# Patient Record
Sex: Female | Born: 1980 | Race: White | Hispanic: No | Marital: Married | Smoking: Never smoker
Health system: Southern US, Community
[De-identification: ages and names within clinical notes are randomized; demographics above are authoritative.]

## PROBLEM LIST (undated history)

## (undated) DIAGNOSIS — Z86018 Personal history of other benign neoplasm: Secondary | ICD-10-CM

## (undated) DIAGNOSIS — E7211 Homocystinuria: Secondary | ICD-10-CM

## (undated) DIAGNOSIS — F419 Anxiety disorder, unspecified: Secondary | ICD-10-CM

## (undated) DIAGNOSIS — F988 Other specified behavioral and emotional disorders with onset usually occurring in childhood and adolescence: Secondary | ICD-10-CM

## (undated) DIAGNOSIS — E7212 Methylenetetrahydrofolate reductase deficiency: Secondary | ICD-10-CM

## (undated) HISTORY — DX: Other specified behavioral and emotional disorders with onset usually occurring in childhood and adolescence: F98.8

## (undated) HISTORY — DX: Personal history of other benign neoplasm: Z86.018

## (undated) HISTORY — DX: Anxiety disorder, unspecified: F41.9

---

## 2011-01-10 NOTE — L&D Delivery Note (Signed)
Delivery Note  First Stage: Labor onset: 0300 Augmentation : none Analgesia /Anesthesia intrapartum: none Light meconium SROM  Second Stage: Complete dilation at 1421 Onset of pushing at 1425 FHR second stage 140  Delivery of a viable female at 1430 by CNM in ROA position.  no nuchal cord - short cord noted Cord double clamped after cessation of pulsation, cut by FOB.  Cord blood sample collected.   Third Stage: Placenta delivered Shultz at 1439 intact with 3 VC.  Placenta - for hospital disposal. Uterine tone firm / small bleeding.  No perineal or vaginal laceration identified - bilateral labial abrasion - single 4-0 vicryl at base left labial abrasion for hemostasis Anesthesia for repair: 1% xylocaine local Repair none needed Est. Blood Loss (mL):  Complications: none  Mom to postpartum.  Baby to Mom for bonding.  Newborn: Birth Weight: 7lb / 2 oz Apgar Scores: 9-10 Feeding planned: breastfeeding  Marlinda Mike CNM, MSN 12/02/2011, 2:58 PM

## 2011-05-03 LAB — OB RESULTS CONSOLE HEPATITIS B SURFACE ANTIGEN: Hepatitis B Surface Ag: NEGATIVE

## 2011-05-03 LAB — OB RESULTS CONSOLE VARICELLA ZOSTER ANTIBODY, IGG: Varicella: IMMUNE

## 2011-05-03 LAB — OB RESULTS CONSOLE ANTIBODY SCREEN: Antibody Screen: NEGATIVE

## 2011-05-12 ENCOUNTER — Other Ambulatory Visit: Payer: Self-pay

## 2011-05-12 LAB — OB RESULTS CONSOLE GC/CHLAMYDIA
Chlamydia: NEGATIVE
Gonorrhea: NEGATIVE

## 2011-06-02 ENCOUNTER — Encounter (HOSPITAL_COMMUNITY): Payer: Self-pay

## 2011-06-02 ENCOUNTER — Ambulatory Visit (HOSPITAL_COMMUNITY)
Admission: RE | Admit: 2011-06-02 | Discharge: 2011-06-02 | Disposition: A | Discharge status: Home / Self Care | Payer: BC Managed Care – PPO | Source: Ambulatory Visit | Attending: Obstetrics & Gynecology | Admitting: Obstetrics & Gynecology

## 2011-06-02 DIAGNOSIS — O09299 Supervision of pregnancy with other poor reproductive or obstetric history, unspecified trimester: Secondary | ICD-10-CM

## 2011-06-02 HISTORY — DX: Methylenetetrahydrofolate reductase deficiency: E72.12

## 2011-06-02 HISTORY — DX: Homocystinuria: E72.11

## 2011-06-02 NOTE — Progress Notes (Addendum)
MATERNAL FETAL MEDICINE CONSULT  Patient Name: Jasmine Combs Medical Record Number:  [id number] Date of Birth: Apr 11, 1980 Requesting Physician Name:  Robley Fries, MD Date of Service: 06/02/2011  Chief Complaint Fetal growth restriction in prior pregnancy  History of Present Illness Jasmine Combs was seen today for prenatal diagnosis secondary to fetal growth restriction in a prior pregnancy at the request of Robley Fries, MD.  The patient is a 31 y.o. G2P0100 at [redacted]w[redacted]d with a EDC of11/27/2013, by Last Menstrual Period  Jasmine Combs's last pregnancy was complicated by fetal growth restriction initially discovered at 28 weeks of gestation.  This ultimately required hospitalization and delivery via LTCS at 32 weeks for fetal distress.  Around the time of delivery she blood pressure became elevated.  Her physician suspected she had developed preeclampsia but this was not confirmed with laboratory studies.  She underwent a thrombophilia workup soon after the pregnancy.  It was negative save for the finding of a MTHFR mutation.  The patient is currently taking extra folic acid secondary to this.  She presents today to discuss recurrence of a similar problems in this pregnancy.   Review of Systems Pertinent items are noted in HPI.  Patient History OB History    Grav Para Term Preterm Abortions TAB SAB Ect Mult Living   2 1  1            # Outc Date GA Lbr Len/2nd Wgt Sex Del Anes PTL Lv   1 PRE 2008 [redacted]w[redacted]d    LTCS      Comments: Fetal growth restriction, placental abruption   2 CUR               Past Medical History  Diagnosis Date  . MTHFR (methylene THF reductase) deficiency and homocystinuria     Past Surgical History  Procedure Date  . Cesarean section     History   Social History  . Marital Status: Married    Spouse Name: N/A    Number of Children: N/A  . Years of Education: N/A   Social History Main Topics  . Smoking status: Never Smoker   . Smokeless tobacco: None    . Alcohol Use: No  . Drug Use: No  . Sexually Active: Yes    Birth Control/ Protection: None   Other Topics Concern  . None   Social History Narrative  . None   Family History The patient has no family history of mental retardation, birth defects, or genetic diseases.  Physical Examination Vitals:  BP 105/60, P 62, Weight 143 lbs. General appearance - alert, well appearing, and in no distress  Assessment and Recommendations 1.  Fetal growth restriction in prior pregnancy.  The recurrence rate of fetal growth restriction for Jasmine Combs is difficult to determine.  Although it was not confirmed, the patient was told there was concern for preeclampsia.  If present this would represent early onset preeclampsia which would carry a 25% recurrence risk.  If one were to discount the role of preeclampsia in her prior fetal growth restriction it is virtually impossible to predict recurrence risk, as she has no other obvious risk factors.  However, this point is mostly academic at this time as there is no effective intervention to the risk of recurrent preeclampsia.  She will require very little outside of routine prenatal care.  I would recommend serial growth scans every 4-6 weeks beginning after her initial fetal anatomic survey at approximately 18 weeks.    I spent  30 minutes with Jasmine Combs today of which 50% was face-to-face counseling.   Rema Fendt, MD

## 2011-11-27 ENCOUNTER — Inpatient Hospital Stay (HOSPITAL_COMMUNITY)
Admission: AD | Admit: 2011-11-27 | Discharge: 2011-11-27 | Disposition: A | Discharge status: Home / Self Care | Payer: BC Managed Care – PPO | Source: Ambulatory Visit | Attending: Obstetrics and Gynecology | Admitting: Obstetrics and Gynecology

## 2011-11-27 ENCOUNTER — Encounter (HOSPITAL_COMMUNITY): Payer: Self-pay

## 2011-11-27 DIAGNOSIS — O99891 Other specified diseases and conditions complicating pregnancy: Secondary | ICD-10-CM | POA: Insufficient documentation

## 2011-11-27 LAB — AMNISURE RUPTURE OF MEMBRANE (ROM) NOT AT ARMC: Amnisure ROM: NEGATIVE

## 2011-11-27 LAB — OB RESULTS CONSOLE GBS: GBS: POSITIVE

## 2011-11-27 NOTE — MAU Note (Signed)
Gush of clear fluid at 3am this morning. Has been leaking small amount of fluid since then but can't tell if she's leaking urine or not. Denies vaginal bleeding or contractions.

## 2011-12-02 ENCOUNTER — Encounter (HOSPITAL_COMMUNITY): Payer: Self-pay | Admitting: *Deleted

## 2011-12-02 ENCOUNTER — Inpatient Hospital Stay (HOSPITAL_COMMUNITY)
Admission: AD | Admit: 2011-12-02 | Discharge: 2011-12-03 | DRG: 373 | Disposition: A | Discharge status: Home / Self Care | Payer: BC Managed Care – PPO | Source: Ambulatory Visit | Attending: Obstetrics and Gynecology | Admitting: Obstetrics and Gynecology

## 2011-12-02 DIAGNOSIS — IMO0001 Reserved for inherently not codable concepts without codable children: Secondary | ICD-10-CM

## 2011-12-02 DIAGNOSIS — O34219 Maternal care for unspecified type scar from previous cesarean delivery: Principal | ICD-10-CM | POA: Diagnosis present

## 2011-12-02 LAB — TYPE AND SCREEN
ABO/RH(D): A POS
Antibody Screen: NEGATIVE

## 2011-12-02 LAB — CBC
HCT: 33.8 % — ABNORMAL LOW (ref 36.0–46.0)
Hemoglobin: 12 g/dL (ref 12.0–15.0)
MCHC: 35.5 g/dL (ref 30.0–36.0)
RBC: 3.65 MIL/uL — ABNORMAL LOW (ref 3.87–5.11)
WBC: 11.9 10*3/uL — ABNORMAL HIGH (ref 4.0–10.5)

## 2011-12-02 LAB — ABO/RH: ABO/RH(D): A POS

## 2011-12-02 MED ORDER — IBUPROFEN 800 MG PO TABS
800.0000 mg | ORAL_TABLET | Freq: Three times a day (TID) | ORAL | Status: DC
  Administered 2011-12-02 – 2011-12-03 (×3): 800 mg via ORAL
  Filled 2011-12-02 (×3): qty 1

## 2011-12-02 MED ORDER — OXYTOCIN 40 UNITS IN LACTATED RINGERS INFUSION - SIMPLE MED
62.5000 mL/h | INTRAVENOUS | Status: DC
  Administered 2011-12-02: 62.5 mL/h via INTRAVENOUS
  Filled 2011-12-02: qty 1000

## 2011-12-02 MED ORDER — ACETAMINOPHEN 325 MG PO TABS: 650.0000 mg | ORAL_TABLET | ORAL | Status: DC | PRN

## 2011-12-02 MED ORDER — FLEET ENEMA 7-19 GM/118ML RE ENEM: 1.0000 | ENEMA | RECTAL | Status: DC | PRN

## 2011-12-02 MED ORDER — LANOLIN HYDROUS EX OINT: TOPICAL_OINTMENT | CUTANEOUS | Status: DC | PRN

## 2011-12-02 MED ORDER — DIPHENHYDRAMINE HCL 25 MG PO CAPS: 25.0000 mg | ORAL_CAPSULE | Freq: Four times a day (QID) | ORAL | Status: DC | PRN

## 2011-12-02 MED ORDER — IBUPROFEN 600 MG PO TABS
600.0000 mg | ORAL_TABLET | Freq: Four times a day (QID) | ORAL | Status: DC | PRN
  Administered 2011-12-02: 600 mg via ORAL
  Filled 2011-12-02: qty 1

## 2011-12-02 MED ORDER — CITRIC ACID-SODIUM CITRATE 334-500 MG/5ML PO SOLN: 30.0000 mL | ORAL | Status: DC | PRN

## 2011-12-02 MED ORDER — LACTATED RINGERS IV SOLN
INTRAVENOUS | Status: DC
  Administered 2011-12-02: 08:00:00 via INTRAVENOUS

## 2011-12-02 MED ORDER — OXYTOCIN BOLUS FROM INFUSION: 500.0000 mL | INTRAVENOUS | Status: DC

## 2011-12-02 MED ORDER — PENICILLIN G POTASSIUM 5000000 UNITS IJ SOLR
5.0000 10*6.[IU] | Freq: Once | INTRAVENOUS | Status: AC
  Administered 2011-12-02: 5 10*6.[IU] via INTRAVENOUS
  Filled 2011-12-02: qty 5

## 2011-12-02 MED ORDER — LACTATED RINGERS IV SOLN: 500.0000 mL | INTRAVENOUS | Status: DC | PRN

## 2011-12-02 MED ORDER — OXYCODONE-ACETAMINOPHEN 5-325 MG PO TABS
1.0000 | ORAL_TABLET | ORAL | Status: DC | PRN
  Filled 2011-12-02: qty 2

## 2011-12-02 MED ORDER — LIDOCAINE HCL (PF) 1 % IJ SOLN
30.0000 mL | INTRAMUSCULAR | Status: DC | PRN
  Administered 2011-12-02: 30 mL via SUBCUTANEOUS
  Filled 2011-12-02: qty 30

## 2011-12-02 MED ORDER — SIMETHICONE 80 MG PO CHEW: 80.0000 mg | CHEWABLE_TABLET | ORAL | Status: DC | PRN

## 2011-12-02 MED ORDER — BENZOCAINE-MENTHOL 20-0.5 % EX AERO
1.0000 "application " | INHALATION_SPRAY | CUTANEOUS | Status: DC | PRN
  Administered 2011-12-02: 1 via TOPICAL
  Filled 2011-12-02: qty 56

## 2011-12-02 MED ORDER — FOLIC ACID 1 MG PO TABS
1.0000 mg | ORAL_TABLET | Freq: Every day | ORAL | Status: DC
  Administered 2011-12-02 – 2011-12-03 (×2): 1 mg via ORAL
  Filled 2011-12-02 (×3): qty 1

## 2011-12-02 MED ORDER — PENICILLIN G POTASSIUM 5000000 UNITS IJ SOLR
2.5000 10*6.[IU] | INTRAMUSCULAR | Status: DC
  Administered 2011-12-02: 2.5 10*6.[IU] via INTRAVENOUS
  Filled 2011-12-02 (×5): qty 2.5

## 2011-12-02 MED ORDER — ONDANSETRON HCL 4 MG/2ML IJ SOLN: 4.0000 mg | Freq: Four times a day (QID) | INTRAMUSCULAR | Status: DC | PRN

## 2011-12-02 MED ORDER — OXYCODONE-ACETAMINOPHEN 5-325 MG PO TABS: 1.0000 | ORAL_TABLET | ORAL | Status: DC | PRN

## 2011-12-02 NOTE — H&P (Signed)
NAMEMarland Combs  PAYING, ARISPE NO.:  [id number]  MEDICAL RECORD NO.:  [id number]  LOCATION:  9133                          FACILITY:  [facility]  PHYSICIAN:  Lenoard Aden, M.D.DATE OF BIRTH:  [birth year]  DATE OF ADMISSION:  12/02/2011 DATE OF DISCHARGE:                             HISTORY & PHYSICAL   CHIEF COMPLAINT:  Labor.  HISTORY OF PRESENT ILLNESS:  She is a 31 year old white female, G2, P1 at 39-3/7th weeks for trial of labor after cesarean section, who presents with cervical change in labor.  She has no known drug allergies.  MEDICATIONS:  Prenatal vitamins, folic acid, Colace p.r.n.  She is a nonsmoker and nondrinker.  She denies domestic or physical violence.  FAMILY HISTORY:  Breast cancer, diabetes, hypertension, heart disease. She has a previous history of a 32-week severe IUGR breech presenting fetus, which was delivered by cesarean section without complication.  No other surgical hospitalizations were noted.  Prenatal course was uncomplicated except for history of positive GBS.  PHYSICAL EXAMINATION:  GENERAL:  A well-developed, well-nourished, white female, in no acute distress. HEENT:  Normal. NECK:  Supple.  Full range of motion. LUNGS:  Clear. HEART:  Regular rate and rhythm. ABDOMEN:  Soft, gravid, nontender.  No CVA tenderness.  Cervix per RN 4- 5, 60%, vertex, -2. EXTREMITIES:  There are no cords. NEUROLOGIC:  Nonfocal. SKIN:  Intact.  IMPRESSION: 1. A 39-week intrauterine pregnancy. 2. Previous cesarean section for trial of labor.  PLAN:  Proceed with trial of labor, anticipate cautious attempts at vaginal delivery.     Lenoard Aden, M.D.     RJT/MEDQ  D:  12/02/2011  T:  12/02/2011  Job:  [id number]

## 2011-12-02 NOTE — MAU Note (Signed)
Dr. Billy Coast notified of cervical change. Orders rec'd.

## 2011-12-02 NOTE — Progress Notes (Signed)
S:  Lots of pressure - starting to feel urge to push  O:  VS: Blood pressure 115/89, pulse 59, temperature 98 F (36.7 C), temperature source Oral, resp. rate 18, height 5\' 6"  (1.676 m), weight 77.565 kg (171 lb), last menstrual period 03/01/2011.        FHR : baseline 140 / variability moderate / accels + / decels none        Toco: contractions every 2-3 minutes spontaneous labor        Cervix : rim / 95% / vtx  / +1        Membranes: slight yellow color to fluids now - previously clear  A: active labor     FHR category 1  P: anticipate SVB - VBAC     Marlinda Mike CNM, MSN 12/02/2011, 2:18 PM

## 2011-12-02 NOTE — MAU Note (Signed)
Pt to walk.

## 2011-12-02 NOTE — MAU Note (Signed)
Pt returned from walking, monitors applied. 

## 2011-12-02 NOTE — Progress Notes (Signed)
Jasmine Combs is a 31 y.o. G2P0101 at [redacted]w[redacted]d by LMP admitted for active labor  Subjective: Coping well  Objective: BP 121/71  Pulse 76  Temp 98.1 F (36.7 C) (Oral)  Resp 18  Ht 5\' 6"  (1.676 m)  Wt 77.565 kg (171 lb)  BMI 27.60 kg/m2  LMP 03/01/2011      FHT:  FHR: 155 bpm, variability: moderate,  accelerations:  Present,  decelerations:  Absent UC:   irregular, every 3-7 minutes SVE:   Dilation: 4.5 Effacement (%): 80 Station: -2 Exam by:: Rudi Coco RN  Labs: No results found for this basename: WBC, HGB, HCT, MCV, PLT    Assessment / Plan: Spontaneous labor, progressing normally TOLAC  Labor: Progressing normally Preeclampsia:  na Fetal Wellbeing:  Category I Pain Control:  Labor support without medications I/D:  n/a Anticipated MOD:  attempted TOLAC  Jasmine Combs 12/02/2011, 8:11 AM

## 2011-12-02 NOTE — Progress Notes (Signed)
IV removed per Provider order

## 2011-12-02 NOTE — MAU Note (Signed)
Dr. Billy Coast notified of pt.  Pt to walk x 1 hr and recheck.

## 2011-12-02 NOTE — MAU Note (Signed)
Pt G2 P1 at 39.3wks having contractions.

## 2011-12-03 MED ORDER — TETANUS-DIPHTH-ACELL PERTUSSIS 5-2.5-18.5 LF-MCG/0.5 IM SUSP
0.5000 mL | Freq: Once | INTRAMUSCULAR | Status: AC
  Administered 2011-12-03: 0.5 mL via INTRAMUSCULAR
  Filled 2011-12-03: qty 0.5

## 2011-12-03 MED ORDER — ACETAMINOPHEN 325 MG PO TABS: 650.0000 mg | ORAL_TABLET | ORAL | Status: DC | PRN

## 2011-12-03 MED ORDER — IBUPROFEN 800 MG PO TABS: 800.0000 mg | ORAL_TABLET | Freq: Three times a day (TID) | ORAL | Status: DC

## 2011-12-03 NOTE — Progress Notes (Signed)
PPD 1 SVD / VBAC  S:  Reports feeling well no concerns or pain             Tolerating po/ No nausea or vomiting             Bleeding is light             Pain controlled with motrin and dermaplast             Up ad lib / ambulatory  Newborn breast feeding well / Female Jasmine Combs   O:               VS: BP 108/66  Pulse 67  Temp 97.8 F (36.6 C) (Oral)  Resp 18  Ht 5\' 6"  (1.676 m)  Wt 77.565 kg (171 lb)  BMI 27.60 kg/m2  SpO2 98%  LMP 03/01/2011  Breastfeeding? Unknown   LABS:  Basename 12/02/11 0748  WBC 11.9*  HGB 12.0  PLT 213                  Physical Exam:             Alert and oriented X3  Lungs: Clear and unlabored  Heart: regular rate and rhythm / no mumurs  Abdomen: soft, non-tender, non-distended              Fundus: firm, non-tender, U-1  Perineum: no edema  Lochia: light  Extremities: no edema, no calf pain or tenderness    A: PPD # 1 VBAC - natural childbirth   Doing well - stable status  P:  Routine post partum orders  Early discharge if ok with Peds  Marlinda Mike CNM, MSN 12/03/2011, 10:12 AM

## 2011-12-03 NOTE — Discharge Summary (Signed)
Obstetric Discharge Summary  Reason for Admission: onset of labor / TOLAC Prenatal Procedures: none Intrapartum Procedures: spontaneous vaginal delivery and GBS prophylaxis Postpartum Procedures: TdaP (ordered at office but not given) Complications-Operative and Postpartum: none Hemoglobin  Date Value Range Status  12/02/2011 12.0  12.0 - 15.0 g/dL Final     HCT  Date Value Range Status  12/02/2011 33.8* 36.0 - 46.0 % Final    Physical Exam:  General: alert, cooperative and no distress Lochia: appropriate Uterine Fundus: firm Incision: intact perineum DVT Evaluation: No evidence of DVT seen on physical exam.  Discharge Diagnoses: Term Pregnancy-delivered / successful VBAC  Discharge Information: Date: 12/03/2011 Activity: pelvic rest Diet: routine Medications: PNV and Ibuprofen Condition: stable Instructions: refer to practice specific booklet Discharge to: home Follow-up Information    Follow up with MODY,VAISHALI R, MD. Schedule an appointment as soon as possible for a visit in 6 weeks.   Contact information:   [address] [place] [address] [phone]          Newborn Data: Live born female  Birth Weight: 7 lb 2.8 oz (3255 g) APGAR: 9, 10  Home with mother.  Marlinda Mike 12/03/2011, 10:18 AM

## 2011-12-04 NOTE — Discharge Summary (Signed)
Reviewed and agree with note and plan. V.Vincient Vanaman, MD  

## 2012-12-23 ENCOUNTER — Encounter: Payer: Self-pay | Admitting: Physician Assistant

## 2012-12-25 ENCOUNTER — Ambulatory Visit (INDEPENDENT_AMBULATORY_CARE_PROVIDER_SITE_OTHER): Payer: BC Managed Care – PPO | Admitting: Physician Assistant

## 2012-12-25 ENCOUNTER — Encounter: Payer: Self-pay | Admitting: Physician Assistant

## 2012-12-25 VITALS — BP 118/62 | HR 60 | Temp 97.9°F | Resp 16 | Ht 67.0 in | Wt 153.0 lb

## 2012-12-25 DIAGNOSIS — Z79899 Other long term (current) drug therapy: Secondary | ICD-10-CM

## 2012-12-25 DIAGNOSIS — E559 Vitamin D deficiency, unspecified: Secondary | ICD-10-CM

## 2012-12-25 DIAGNOSIS — F411 Generalized anxiety disorder: Secondary | ICD-10-CM

## 2012-12-25 DIAGNOSIS — F988 Other specified behavioral and emotional disorders with onset usually occurring in childhood and adolescence: Secondary | ICD-10-CM

## 2012-12-25 LAB — LIPID PANEL
Cholesterol: 188 mg/dL (ref 0–200)
HDL: 76 mg/dL (ref >39)
LDL Cholesterol: 101 mg/dL — ABNORMAL HIGH (ref 0–99)
Triglycerides: 55 mg/dL (ref <150)

## 2012-12-25 LAB — BASIC METABOLIC PANEL WITH GFR
BUN: 15 mg/dL (ref 6–23)
Calcium: 9.3 mg/dL (ref 8.4–10.5)
Chloride: 103 mEq/L (ref 96–112)
GFR, Est African American: 81 mL/min
GFR, Est Non African American: 70 mL/min
Glucose, Bld: 91 mg/dL (ref 70–99)
Potassium: 4.5 mEq/L (ref 3.5–5.3)
Sodium: 138 mEq/L (ref 135–145)

## 2012-12-25 LAB — HEPATIC FUNCTION PANEL
ALT: 11 U/L (ref 0–35)
AST: 16 U/L (ref 0–37)
Albumin: 4.4 g/dL (ref 3.5–5.2)
Bilirubin, Direct: 0.1 mg/dL (ref 0.0–0.3)
Total Bilirubin: 0.6 mg/dL (ref 0.3–1.2)
Total Protein: 6.6 g/dL (ref 6.0–8.3)

## 2012-12-25 LAB — CBC WITH DIFFERENTIAL/PLATELET
Basophils Absolute: 0 10*3/uL (ref 0.0–0.1)
Basophils Relative: 0 % (ref 0–1)
Eosinophils Absolute: 0.1 10*3/uL (ref 0.0–0.7)
HCT: 40.3 % (ref 36.0–46.0)
Hemoglobin: 13.5 g/dL (ref 12.0–15.0)
MCH: 30.7 pg (ref 26.0–34.0)
MCHC: 33.5 g/dL (ref 30.0–36.0)
Monocytes Absolute: 0.5 10*3/uL (ref 0.1–1.0)
Monocytes Relative: 8 % (ref 3–12)
Neutro Abs: 3.9 10*3/uL (ref 1.7–7.7)
Platelets: 201 10*3/uL (ref 150–400)
RBC: 4.4 MIL/uL (ref 3.87–5.11)
RDW: 13 % (ref 11.5–15.5)

## 2012-12-25 LAB — MAGNESIUM: Magnesium: 1.9 mg/dL (ref 1.5–2.5)

## 2012-12-25 MED ORDER — AMPHETAMINE-DEXTROAMPHETAMINE 20 MG PO TABS: 20.0000 mg | ORAL_TABLET | Freq: Three times a day (TID) | ORAL | Status: DC | PRN

## 2012-12-25 NOTE — Progress Notes (Signed)
HPI Patient presents for a follow up. She started to be followed by OB/GYN for her pregnancy with her daughter, [name], who is a year old. She also has a 32 year old. She is no longer breast feeding and is planning on going back to school to start masters and goes back Jan 12. She states that the adderall helps her and will take as needed.   Past Medical History  Diagnosis Date  . MTHFR (methylene THF reductase) deficiency and homocystinuria   . Preterm labor   . History of dysplastic nevus      No Known Allergies    Current Outpatient Prescriptions on File Prior to Visit  Medication Sig Dispense Refill  . amphetamine-dextroamphetamine (ADDERALL) 10 MG tablet Take 10 mg by mouth 2 (two) times daily with a meal.       No current facility-administered medications on file prior to visit.    ROS: all negative expect above.   Physical: Filed Weights   12/25/12 1104  Weight: 153 lb (69.4 kg)   Filed Vitals:   12/25/12 1104  BP: 118/62  Pulse: 60  Temp: 97.9 F (36.6 C)  Resp: 16   General Appearance: Well nourished, in no apparent distress. Eyes: PERRLA, EOMs. Sinuses: No Frontal/maxillary tenderness ENT/Mouth: Ext aud canals clear, normal light reflex with TMs without erythema, bulging. Post pharynx without erythema, swelling, exudate.  Respiratory: CTAB Cardio: RRR, no murmurs, rubs or gallops. Peripheral pulses brisk and equal bilaterally, without edema. No aortic or femoral bruits. Abdomen: Soft, with bowl sounds. Nontender, no guarding, rebound. Lymphatics: Non tender without lymphadenopathy.  Musculoskeletal: Full ROM all peripheral extremities, 5/5 strength, and normal gait. Skin: Warm, dry without rashes, lesions, ecchymosis.  Neuro: Cranial nerves intact, reflexes equal bilaterally. Normal muscle tone, no cerebellar symptoms. Sensation intact.  Pysch: Awake and oriented X 3, normal affect, Insight and Judgment appropriate.   Assessment and Plan: ADD- Adderall 20mg   TID #90 NR  Will follow up as needed Check labs

## 2012-12-25 NOTE — Patient Instructions (Signed)
Attention Deficit Hyperactivity Disorder Attention deficit hyperactivity disorder (ADHD) is a problem with behavior issues based on the way the brain functions (neurobehavioral disorder). It is a common reason for behavior and academic problems in school. CAUSES  The cause of ADHD is unknown in most cases. It may run in families. It sometimes can be associated with learning disabilities and other behavioral problems. SYMPTOMS  There are 3 types of ADHD. The 3 types and some of the symptoms include:  Inattentive  Gets bored or distracted easily.  Loses or forgets things. Forgets to hand in homework.  Has trouble organizing or completing tasks.  Difficulty staying on task.  An inability to organize daily tasks and school work.  Leaving projects, chores, or homework unfinished.  Trouble paying attention or responding to details. Careless mistakes.  Difficulty following directions. Often seems like is not listening.  Dislikes activities that require sustained attention (like chores or homework).  Hyperactive-impulsive  Feels like it is impossible to sit still or stay in a seat. Fidgeting with hands and feet.  Trouble waiting turn.  Talking too much or out of turn. Interruptive.  Speaks or acts impulsively.  Aggressive, disruptive behavior.  Constantly busy or on the go, noisy.  Combined  Has symptoms of both of the above. Often children with ADHD feel discouraged about themselves and with school. They often perform well below their abilities in school. These symptoms can cause problems in home, school, and in relationships with peers. As children get older, the excess motor activities can calm down, but the problems with paying attention and staying organized persist. Most children do not outgrow ADHD but with good treatment can learn to cope with the symptoms. DIAGNOSIS  When ADHD is suspected, the diagnosis should be made by professionals trained in ADHD.  Diagnosis will  include:  Ruling out other reasons for the child's behavior.  The caregivers will check with the child's school and check their medical records.  They will talk to teachers and parents.  Behavior rating scales for the child will be filled out by those dealing with the child on a daily basis. A diagnosis is made only after all information has been considered. TREATMENT  Treatment usually includes behavioral treatment often along with medicines. It may include stimulant medicines. The stimulant medicines decrease impulsivity and hyperactivity and increase attention. Other medicines used include antidepressants and certain blood pressure medicines. Most experts agree that treatment for ADHD should address all aspects of the child's functioning. Treatment should not be limited to the use of medicines alone. Treatment should include structured classroom management. The parents must receive education to address rewarding good behavior, discipline, and limit-setting. Tutoring or behavioral therapy or both should be available for the child. If untreated, the disorder can have long-term serious effects into adolescence and adulthood. HOME CARE INSTRUCTIONS   Often with ADHD there is a lot of frustration among the family in dealing with the illness. There is often blame and anger that is not warranted. This is a life long illness. There is no way to prevent ADHD. In many cases, because the problem affects the family as a whole, the entire family may need help. A therapist can help the family find better ways to handle the disruptive behaviors and promote change. If the child is young, most of the therapist's work is with the parents. Parents will learn techniques for coping with and improving their child's behavior. Sometimes only the child with the ADHD needs counseling. Your caregivers can help   you make these decisions.  Children with ADHD may need help in organizing. Some helpful tips include:  Keep  routines the same every day from wake-up time to bedtime. Schedule everything. This includes homework and playtime. This should include outdoor and indoor recreation. Keep the schedule on the refrigerator or a bulletin board where it is frequently seen. Mark schedule changes as far in advance as possible.  Have a place for everything and keep everything in its place. This includes clothing, backpacks, and school supplies.  Encourage writing down assignments and bringing home needed books.  Offer your child a well-balanced diet. Breakfast is especially important for school performance. Children should avoid drinks with caffeine including:  Soft drinks.  Coffee.  Tea.  However, some older children (adolescents) may find these drinks helpful in improving their attention.  Children with ADHD need consistent rules that they can understand and follow. If rules are followed, give small rewards. Children with ADHD often receive, and expect, criticism. Look for good behavior and praise it. Set realistic goals. Give clear instructions. Look for activities that can foster success and self-esteem. Make time for pleasant activities with your child. Give lots of affection.  Parents are their children's greatest advocates. Learn as much as possible about ADHD. This helps you become a stronger and better advocate for your child. It also helps you educate your child's teachers and instructors if they feel inadequate in these areas. Parent support groups are often helpful. A national group with local chapters is called CHADD (Children and Adults with Attention Deficit Hyperactivity Disorder). PROGNOSIS  There is no cure for ADHD. Children with the disorder seldom outgrow it. Many find adaptive ways to accommodate the ADHD as they mature. SEEK MEDICAL CARE IF:  Your child has repeated muscle twitches, cough or speech outbursts.  Your child has sleep problems.  Your child has a marked loss of  appetite.  Your child develops depression.  Your child has new or worsening behavioral problems.  Your child develops dizziness.  Your child has a racing heart.  Your child has stomach pains.  Your child develops headaches. Document Released: 12/16/2001 Document Revised: 03/20/2011 Document Reviewed: 07/17/2012 [employer] Patient Information 2014 [employer].  

## 2013-01-07 ENCOUNTER — Telehealth: Payer: Self-pay

## 2013-01-07 ENCOUNTER — Other Ambulatory Visit: Payer: Self-pay | Admitting: Physician Assistant

## 2013-01-07 MED ORDER — AMPHETAMINE-DEXTROAMPHETAMINE 20 MG PO TABS: 20.0000 mg | ORAL_TABLET | Freq: Two times a day (BID) | ORAL | Status: DC

## 2013-01-07 NOTE — Telephone Encounter (Signed)
Advised patient that insurance will not cover Adderall 10mg  three times daiky, Marchelle Folks prescribed Adderall 20mg  twice daily and advised patient to break tabs in half, she was advised per Marchelle Folks that this RX should last a long time

## 2013-04-16 ENCOUNTER — Other Ambulatory Visit: Payer: Self-pay | Admitting: Physician Assistant

## 2013-05-01 ENCOUNTER — Other Ambulatory Visit: Payer: Self-pay | Admitting: Emergency Medicine

## 2013-05-01 MED ORDER — AMPHETAMINE-DEXTROAMPHETAMINE 20 MG PO TABS: 20.0000 mg | ORAL_TABLET | Freq: Two times a day (BID) | ORAL | Status: DC

## 2013-09-09 ENCOUNTER — Other Ambulatory Visit: Payer: Self-pay | Admitting: Physician Assistant

## 2013-09-09 MED ORDER — AMPHETAMINE-DEXTROAMPHETAMINE 20 MG PO TABS: 20.0000 mg | ORAL_TABLET | Freq: Two times a day (BID) | ORAL | Status: DC

## 2013-11-10 ENCOUNTER — Encounter: Payer: Self-pay | Admitting: Physician Assistant

## 2013-11-28 ENCOUNTER — Other Ambulatory Visit: Payer: Self-pay | Admitting: Internal Medicine

## 2013-11-28 DIAGNOSIS — F988 Other specified behavioral and emotional disorders with onset usually occurring in childhood and adolescence: Secondary | ICD-10-CM

## 2013-11-28 MED ORDER — AMPHETAMINE-DEXTROAMPHETAMINE 20 MG PO TABS: ORAL_TABLET | ORAL | Status: DC

## 2013-12-25 ENCOUNTER — Encounter: Payer: Self-pay | Admitting: Physician Assistant

## 2013-12-25 ENCOUNTER — Ambulatory Visit (INDEPENDENT_AMBULATORY_CARE_PROVIDER_SITE_OTHER): Payer: BC Managed Care – PPO | Admitting: Physician Assistant

## 2013-12-25 ENCOUNTER — Other Ambulatory Visit (HOSPITAL_COMMUNITY)
Admission: RE | Admit: 2013-12-25 | Discharge: 2013-12-25 | Disposition: A | Discharge status: Home / Self Care | Payer: BC Managed Care – PPO | Source: Ambulatory Visit | Attending: Physician Assistant | Admitting: Physician Assistant

## 2013-12-25 VITALS — BP 102/62 | HR 60 | Temp 98.0°F | Resp 16 | Ht 67.0 in | Wt 137.0 lb

## 2013-12-25 DIAGNOSIS — R946 Abnormal results of thyroid function studies: Secondary | ICD-10-CM

## 2013-12-25 DIAGNOSIS — E785 Hyperlipidemia, unspecified: Secondary | ICD-10-CM

## 2013-12-25 DIAGNOSIS — Z79899 Other long term (current) drug therapy: Secondary | ICD-10-CM

## 2013-12-25 DIAGNOSIS — N926 Irregular menstruation, unspecified: Secondary | ICD-10-CM | POA: Insufficient documentation

## 2013-12-25 DIAGNOSIS — N898 Other specified noninflammatory disorders of vagina: Secondary | ICD-10-CM

## 2013-12-25 DIAGNOSIS — Z124 Encounter for screening for malignant neoplasm of cervix: Secondary | ICD-10-CM

## 2013-12-25 DIAGNOSIS — Z01411 Encounter for gynecological examination (general) (routine) with abnormal findings: Secondary | ICD-10-CM | POA: Insufficient documentation

## 2013-12-25 DIAGNOSIS — Z1151 Encounter for screening for human papillomavirus (HPV): Secondary | ICD-10-CM | POA: Diagnosis present

## 2013-12-25 DIAGNOSIS — F909 Attention-deficit hyperactivity disorder, unspecified type: Secondary | ICD-10-CM

## 2013-12-25 DIAGNOSIS — N760 Acute vaginitis: Secondary | ICD-10-CM | POA: Insufficient documentation

## 2013-12-25 DIAGNOSIS — F988 Other specified behavioral and emotional disorders with onset usually occurring in childhood and adolescence: Secondary | ICD-10-CM

## 2013-12-25 DIAGNOSIS — E559 Vitamin D deficiency, unspecified: Secondary | ICD-10-CM

## 2013-12-25 DIAGNOSIS — F419 Anxiety disorder, unspecified: Secondary | ICD-10-CM

## 2013-12-25 LAB — HEPATIC FUNCTION PANEL
ALT: 8 U/L (ref 0–35)
AST: 13 U/L (ref 0–37)
Albumin: 4.2 g/dL (ref 3.5–5.2)
Alkaline Phosphatase: 38 U/L — ABNORMAL LOW (ref 39–117)
BILIRUBIN TOTAL: 0.6 mg/dL (ref 0.2–1.2)
Bilirubin, Direct: 0.1 mg/dL (ref 0.0–0.3)
Indirect Bilirubin: 0.5 mg/dL (ref 0.2–1.2)
TOTAL PROTEIN: 6.4 g/dL (ref 6.0–8.3)

## 2013-12-25 LAB — CBC WITH DIFFERENTIAL/PLATELET
Basophils Absolute: 0 10*3/uL (ref 0.0–0.1)
Basophils Relative: 0 % (ref 0–1)
Eosinophils Absolute: 0.1 10*3/uL (ref 0.0–0.7)
Eosinophils Relative: 1 % (ref 0–5)
HEMATOCRIT: 38.3 % (ref 36.0–46.0)
Hemoglobin: 12.9 g/dL (ref 12.0–15.0)
LYMPHS ABS: 1.7 10*3/uL (ref 0.7–4.0)
Lymphocytes Relative: 24 % (ref 12–46)
MCH: 31 pg (ref 26.0–34.0)
MCHC: 33.7 g/dL (ref 30.0–36.0)
MCV: 92.1 fL (ref 78.0–100.0)
MPV: 10 fL (ref 9.4–12.4)
Monocytes Absolute: 0.6 10*3/uL (ref 0.1–1.0)
Monocytes Relative: 9 % (ref 3–12)
Neutro Abs: 4.8 10*3/uL (ref 1.7–7.7)
Neutrophils Relative %: 66 % (ref 43–77)
Platelets: 188 10*3/uL (ref 150–400)
RBC: 4.16 MIL/uL (ref 3.87–5.11)
RDW: 13.1 % (ref 11.5–15.5)
WBC: 7.2 10*3/uL (ref 4.0–10.5)

## 2013-12-25 LAB — BASIC METABOLIC PANEL WITH GFR
BUN: 13 mg/dL (ref 6–23)
CALCIUM: 8.9 mg/dL (ref 8.4–10.5)
CO2: 24 mEq/L (ref 19–32)
CREATININE: 1.18 mg/dL — AB (ref 0.50–1.10)
Chloride: 106 mEq/L (ref 96–112)
GFR, Est African American: 70 mL/min
GFR, Est Non African American: 61 mL/min
GLUCOSE: 83 mg/dL (ref 70–99)
Potassium: 4.2 mEq/L (ref 3.5–5.3)
Sodium: 138 mEq/L (ref 135–145)

## 2013-12-25 LAB — LIPID PANEL
Cholesterol: 176 mg/dL (ref 0–200)
HDL: 64 mg/dL (ref >39)
LDL Cholesterol: 100 mg/dL — ABNORMAL HIGH (ref 0–99)
Total CHOL/HDL Ratio: 2.8 Ratio
Triglycerides: 61 mg/dL (ref <150)
VLDL: 12 mg/dL (ref 0–40)

## 2013-12-25 LAB — MAGNESIUM: Magnesium: 2 mg/dL (ref 1.5–2.5)

## 2013-12-25 LAB — TSH: TSH: 1.878 u[IU]/mL (ref 0.350–4.500)

## 2013-12-25 MED ORDER — AMPHETAMINE-DEXTROAMPHETAMINE 20 MG PO TABS: ORAL_TABLET | ORAL | Status: DC

## 2013-12-25 NOTE — Patient Instructions (Signed)

## 2013-12-25 NOTE — Progress Notes (Signed)
Complete Physical  Assessment and Plan: 1. ADD (attention deficit disorder) ADD-  Continue ADD medication, helps with focus, no AE's. The patient was counseled on the addictive nature of the medication and was encouraged to take drug holidays when not needed.  - amphetamine-dextroamphetamine (ADDERALL) 20 MG tablet; Take 1/2 to 1 tablet 1 to 2 x day if needed for ADD - Need Office Visit before further refills  Dispense: 60 tablet; Refill: 0  2. Anxiety Anxiety- continue medications, stress management techniques discussed, increase water, good sleep hygiene discussed, increase exercise, and increase veggies.  - Magnesium  3. Abnormal menses Will check labs, wet prep, etc - CBC with Differential - BASIC METABOLIC PANEL WITH GFR - Hepatic function panel - TSH - Urinalysis, Routine w reflex microscopic - Urine culture - Microalbumin / creatinine urine ratio - GC/chlamydia probe amp, urine  4. Hyperlipidemia - Lipid panel  5. Abnormal thyroid exam - US Soft Tissue Head/Neck; Future  6. Screening for cervical cancer - Cytology - PAP  7. Vitamin D deficiency - Vit D  25 hydroxy (rtn osteoporosis monitoring)  8. Vaginal discharge Check wet prep/STD testing.    Discussed med's effects and SE's. Screening labs and tests as requested with regular follow-up as recommended.  HPI  33 y.o. female  presents for a complete physical.   Her blood pressure has been controlled at home, today their BP is BP: 102/62 mmHg She does workout. She denies chest pain, shortness of breath, dizziness.  She is not on cholesterol medication and denies myalgias. Her cholesterol is at goal. The cholesterol last visit was:   Lab Results  Component Value Date   CHOL 188 12/25/2012   HDL 76 12/25/2012   LDLCALC 101* 12/25/2012   TRIG 55 12/25/2012   CHOLHDL 2.5 12/25/2012   [name]   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  She is married,  has a 834 year old daughter Jasmine Combs and a 33 year old son.  She states she has had intermittent spotting between menses for last 4-5 months, some vaginal discharge, denies dysparunia, dysuria.   Current Medications:  Current Outpatient Prescriptions on File Prior to Visit  Medication Sig Dispense Refill  . amphetamine-dextroamphetamine (ADDERALL) 20 MG tablet Take 1/2 to 1 tablet 1 to 2 x day if needed for ADD - Need Office Visit before further refills 60 tablet 0   No current facility-administered medications on file prior to visit.   Health Maintenance:   Immunization History  Administered Date(s) Administered  . Tdap 12/03/2011   Tetanus: 2013 Pneumovax: Flu vaccine:2015  Zostavax: LMP: 11/26/2013, regular, Pap: Has had abnormal pap once 3-4 years, 2012 due this year, wants to get here.  MGM: 2012, mom with stage 3 cancer at age 33, and had ovarian cancer, mom with 2 sister and GM without cancer.   DEXA: Colonoscopy: EGD: Last Dental Exam: Jasmine Combs Last Eye Exam: Jasmine Combs Combs- Jasmine Combs, sees Jasmine Combs and has appointment yesterday.   Patient Care Team: Jasmine CowboyWilliam McKeown, MD as PCP - General (Internal Medicine) Jasmine PennerMark L. Mitzi HansenMoody, MD as Referring Physician (Orthopedic Surgery)  Allergies: No Known Allergies Medical History:  Past Medical History  Diagnosis Date  . MTHFR (methylene THF reductase) deficiency and homocystinuria   . Preterm labor   .  History of dysplastic nevus   . ADD (attention deficit disorder)   . Anxiety    Surgical History:  Past Surgical History  Procedure Laterality Date  . Cesarean section     Family History:  Family History  Problem Relation Age of Onset  . Cancer Mother [id number]    breast  . Hypertension Father   . Heart disease Father   . Diabetes Father   . Alcohol abuse Father   . Hyperlipidemia Father     Social History:  History  Substance Use Topics  . Smoking status: Never Smoker   . Smokeless tobacco: Never Used  . Alcohol Use: Yes     Comment: rare   Review of Systems: Review of Systems  Constitutional: Negative.   HENT: Negative.   Eyes: Negative.   Respiratory: Negative.   Cardiovascular: Negative.   Gastrointestinal: Negative.   Genitourinary: Negative.        + vaginal discharge and spotting  Musculoskeletal: Negative.   Skin: Negative.   Neurological: Negative.   Psychiatric/Behavioral: Negative.     Physical Exam: Estimated body mass index is 21.45 kg/(m^2) as calculated from the following:   Height as of this encounter: 5\' 7"  (1.702 m).   Weight as of this encounter: 137 lb (62.143 kg). BP 102/62 mmHg  Pulse 60  Temp(Src) 98 F (36.7 C)  Resp 16  Ht 5\' 7"  (1.702 m)  Wt 137 lb (62.143 kg)  BMI 21.45 kg/m2 General Appearance: Well nourished, in no apparent distress.  Eyes: PERRLA, EOMs, conjunctiva no swelling or erythema, normal fundi and vessels.  Sinuses: No Frontal/maxillary tenderness  ENT/Mouth: Ext aud canals clear, normal light reflex with TMs without erythema, bulging. Good dentition. + mild erythema no swelling, or exudate on post pharynx. Tonsils not swollen or erythematous. Hearing normal.  Neck: Supple, thyroid nodules appreciated. No bruits  Respiratory: Respiratory effort normal, BS equal bilaterally without rales, rhonchi, wheezing or stridor.  Cardio: RRR without murmurs, rubs or gallops. Brisk peripheral pulses without edema.  Chest: symmetric, with normal excursions and percussion.  Breasts: Symmetric, + mobile small lumps, without nipple discharge, retractions.  Abdomen: Soft, nontender, flat, no guarding, rebound, hernias, masses, or organomegaly. .  Lymphatics: Non tender without lymphadenopathy.  Genitourinary: normal external genitalia, vulva, vagina, cervix, uterus and adnexa, CERVIX: cervical discharge present - dark, bloody and  odorless, WET MOUNT done - results: dent to lab, multiparous os, PAP: Pap smear done today. Musculoskeletal: Full ROM all peripheral extremities,5/5 strength, and normal gait.  Skin: Warm, dry without rashes, lesions, ecchymosis. Neuro: Cranial nerves intact, reflexes equal bilaterally. Normal muscle tone, no cerebellar symptoms. Sensation intact.  Psych: Awake and oriented X 3, normal affect, Insight and Judgment appropriate.   EKG:  declines   Jasmine Combs, Jasmine Combs 9:30 AM [facility]

## 2013-12-26 LAB — MICROALBUMIN / CREATININE URINE RATIO
Creatinine, Urine: 272.3 mg/dL
Microalb Creat Ratio: 16.2 mg/g (ref 0.0–30.0)
Microalb, Ur: 4.4 mg/dL — ABNORMAL HIGH (ref <2.0)

## 2013-12-26 LAB — URINALYSIS, ROUTINE W REFLEX MICROSCOPIC
Bilirubin Urine: NEGATIVE
Glucose, UA: NEGATIVE mg/dL
Hgb urine dipstick: NEGATIVE
Ketones, ur: NEGATIVE mg/dL
LEUKOCYTES UA: NEGATIVE
NITRITE: NEGATIVE
Protein, ur: NEGATIVE mg/dL
SPECIFIC GRAVITY, URINE: 1.024 (ref 1.005–1.030)
Urobilinogen, UA: 0.2 mg/dL (ref 0.0–1.0)
pH: 6 (ref 5.0–8.0)

## 2013-12-26 LAB — URINE CULTURE
Colony Count: NO GROWTH
ORGANISM ID, BACTERIA: NO GROWTH

## 2013-12-26 LAB — GC/CHLAMYDIA PROBE AMP, URINE
Chlamydia, Swab/Urine, PCR: NEGATIVE
GC PROBE AMP, URINE: NEGATIVE

## 2013-12-26 LAB — VITAMIN D 25 HYDROXY (VIT D DEFICIENCY, FRACTURES): VIT D 25 HYDROXY: 38 ng/mL (ref 30–100)

## 2013-12-26 LAB — CYTOLOGY - PAP

## 2013-12-31 LAB — CERVICOVAGINAL ANCILLARY ONLY
BACTERIAL VAGINITIS: NEGATIVE
CANDIDA VAGINITIS: NEGATIVE

## 2014-01-06 ENCOUNTER — Other Ambulatory Visit: Payer: BC Managed Care – PPO

## 2014-01-12 ENCOUNTER — Ambulatory Visit
Admission: RE | Admit: 2014-01-12 | Discharge: 2014-01-12 | Disposition: A | Discharge status: Home / Self Care | Payer: BC Managed Care – PPO | Source: Ambulatory Visit | Attending: Physician Assistant | Admitting: Physician Assistant

## 2014-01-12 DIAGNOSIS — R946 Abnormal results of thyroid function studies: Secondary | ICD-10-CM

## 2014-03-06 ENCOUNTER — Other Ambulatory Visit: Payer: Self-pay | Admitting: Physician Assistant

## 2014-03-06 DIAGNOSIS — F988 Other specified behavioral and emotional disorders with onset usually occurring in childhood and adolescence: Secondary | ICD-10-CM

## 2014-03-06 MED ORDER — AMPHETAMINE-DEXTROAMPHETAMINE 20 MG PO TABS: ORAL_TABLET | ORAL | Status: DC

## 2014-04-16 ENCOUNTER — Other Ambulatory Visit: Payer: Self-pay | Admitting: Internal Medicine

## 2014-04-16 DIAGNOSIS — F988 Other specified behavioral and emotional disorders with onset usually occurring in childhood and adolescence: Secondary | ICD-10-CM

## 2014-04-16 MED ORDER — AMPHETAMINE-DEXTROAMPHETAMINE 20 MG PO TABS: ORAL_TABLET | ORAL | Status: DC

## 2014-05-18 ENCOUNTER — Other Ambulatory Visit: Payer: Self-pay | Admitting: Internal Medicine

## 2014-05-18 DIAGNOSIS — F988 Other specified behavioral and emotional disorders with onset usually occurring in childhood and adolescence: Secondary | ICD-10-CM

## 2014-05-18 MED ORDER — AMPHETAMINE-DEXTROAMPHETAMINE 20 MG PO TABS: ORAL_TABLET | ORAL | Status: DC

## 2014-06-18 ENCOUNTER — Encounter: Payer: Self-pay | Admitting: Physician Assistant

## 2014-06-18 ENCOUNTER — Other Ambulatory Visit: Payer: Self-pay | Admitting: Physician Assistant

## 2014-06-18 DIAGNOSIS — F988 Other specified behavioral and emotional disorders with onset usually occurring in childhood and adolescence: Secondary | ICD-10-CM

## 2014-06-18 MED ORDER — AMPHETAMINE-DEXTROAMPHETAMINE 20 MG PO TABS: ORAL_TABLET | ORAL | Status: DC

## 2014-07-02 ENCOUNTER — Ambulatory Visit: Payer: Self-pay | Admitting: Physician Assistant

## 2014-07-20 ENCOUNTER — Ambulatory Visit: Payer: Self-pay | Admitting: Physician Assistant

## 2014-07-24 ENCOUNTER — Encounter: Payer: Self-pay | Admitting: Physician Assistant

## 2014-07-24 ENCOUNTER — Ambulatory Visit (INDEPENDENT_AMBULATORY_CARE_PROVIDER_SITE_OTHER): Payer: BLUE CROSS/BLUE SHIELD | Admitting: Physician Assistant

## 2014-07-24 VITALS — BP 102/68 | HR 60 | Temp 97.7°F | Resp 16 | Ht 67.0 in | Wt 137.0 lb

## 2014-07-24 DIAGNOSIS — F909 Attention-deficit hyperactivity disorder, unspecified type: Secondary | ICD-10-CM

## 2014-07-24 DIAGNOSIS — E559 Vitamin D deficiency, unspecified: Secondary | ICD-10-CM

## 2014-07-24 DIAGNOSIS — E785 Hyperlipidemia, unspecified: Secondary | ICD-10-CM

## 2014-07-24 DIAGNOSIS — N289 Disorder of kidney and ureter, unspecified: Secondary | ICD-10-CM

## 2014-07-24 DIAGNOSIS — F988 Other specified behavioral and emotional disorders with onset usually occurring in childhood and adolescence: Secondary | ICD-10-CM | POA: Insufficient documentation

## 2014-07-24 DIAGNOSIS — F419 Anxiety disorder, unspecified: Secondary | ICD-10-CM

## 2014-07-24 LAB — BASIC METABOLIC PANEL WITH GFR
BUN: 15 mg/dL (ref 6–23)
CO2: 28 mEq/L (ref 19–32)
Calcium: 9.7 mg/dL (ref 8.4–10.5)
Chloride: 105 mEq/L (ref 96–112)
Creat: 1.11 mg/dL — ABNORMAL HIGH (ref 0.50–1.10)
GFR, EST AFRICAN AMERICAN: 75 mL/min
GFR, Est Non African American: 65 mL/min
Glucose, Bld: 74 mg/dL (ref 70–99)
Potassium: 5.1 mEq/L (ref 3.5–5.3)
SODIUM: 141 meq/L (ref 135–145)

## 2014-07-24 LAB — CBC WITH DIFFERENTIAL/PLATELET
BASOS PCT: 1 % (ref 0–1)
Basophils Absolute: 0 10*3/uL (ref 0.0–0.1)
EOS PCT: 3 % (ref 0–5)
Eosinophils Absolute: 0.1 10*3/uL (ref 0.0–0.7)
HEMATOCRIT: 42.4 % (ref 36.0–46.0)
Hemoglobin: 14.1 g/dL (ref 12.0–15.0)
LYMPHS ABS: 1.8 10*3/uL (ref 0.7–4.0)
Lymphocytes Relative: 38 % (ref 12–46)
MCH: 31.5 pg (ref 26.0–34.0)
MCHC: 33.3 g/dL (ref 30.0–36.0)
MCV: 94.9 fL (ref 78.0–100.0)
MPV: 9.6 fL (ref 8.6–12.4)
Monocytes Absolute: 0.2 10*3/uL (ref 0.1–1.0)
Monocytes Relative: 4 % (ref 3–12)
Neutro Abs: 2.5 10*3/uL (ref 1.7–7.7)
Neutrophils Relative %: 54 % (ref 43–77)
PLATELETS: 243 10*3/uL (ref 150–400)
RBC: 4.47 MIL/uL (ref 3.87–5.11)
RDW: 13.5 % (ref 11.5–15.5)
WBC: 4.7 10*3/uL (ref 4.0–10.5)

## 2014-07-24 LAB — HEPATIC FUNCTION PANEL
ALK PHOS: 39 U/L (ref 39–117)
ALT: 13 U/L (ref 0–35)
AST: 15 U/L (ref 0–37)
Albumin: 4.5 g/dL (ref 3.5–5.2)
BILIRUBIN INDIRECT: 0.5 mg/dL (ref 0.2–1.2)
Bilirubin, Direct: 0.1 mg/dL (ref 0.0–0.3)
TOTAL PROTEIN: 7.1 g/dL (ref 6.0–8.3)
Total Bilirubin: 0.6 mg/dL (ref 0.2–1.2)

## 2014-07-24 LAB — MAGNESIUM: MAGNESIUM: 2 mg/dL (ref 1.5–2.5)

## 2014-07-24 MED ORDER — AMPHETAMINE-DEXTROAMPHETAMINE 20 MG PO TABS: ORAL_TABLET | ORAL | Status: DC

## 2014-07-24 NOTE — Patient Instructions (Signed)
Vitamin D goal is between 60-80  Please make sure that you are taking your Vitamin D as directed.   It is very important as a natural anti-inflammatory   helping hair, skin, and nails, as well as reducing stroke and heart attack risk.   It helps your bones and helps with mood.  It also decreases numerous cancer risks so please take it as directed.   Low Vit D is associated with a 200-300% higher risk for CANCER   and 200-300% higher risk for HEART   ATTACK  &  STROKE.    .....................................Marland Kitchen  It is also associated with higher death rate at younger ages,   autoimmune diseases like Rheumatoid arthritis, Lupus, Multiple Sclerosis.     Also many other serious conditions, like depression, Alzheimer's  Dementia, infertility, muscle aches, fatigue, fibromyalgia - just to name a few.  +++++++++++++++++++    Acute Kidney Injury Acute kidney injury is a disease in which there is sudden (acute) damage to the kidneys. The kidneys are 2 organs that lie on either side of the spine between the middle of the back and the front of the abdomen. The kidneys:  Remove wastes and extra water from the blood.   Produce important hormones. These help keep bones strong, regulate blood pressure, and help create red blood cells.   Balance the fluids and chemicals in the blood and tissues. A small amount of kidney damage may not cause problems, but a large amount of damage may make it difficult or impossible for the kidneys to work the way they should. Acute kidney injury may develop into long-lasting (chronic) kidney disease. It may also develop into a life-threatening disease called end-stage kidney disease. Acute kidney injury can get worse very quickly, so it should be treated right away. Early treatment may prevent other kidney diseases from developing.  CAUSES   A problem with blood flow to the kidneys. This may be caused by:   Blood loss.   Heart disease.   Severe  burns.   Liver disease.  Direct damage to the kidneys. This may be caused by:  Some medicines.   A kidney infection.   Poisoning or consuming toxic substances.   A surgical wound.   A blow to the kidney area.   A problem with urine flow. This may be caused by:   Cancer.   Kidney stones.   An enlarged prostate. SYMPTOMS   Swelling (edema) of the legs, ankles, or feet.   Tiredness (lethargy).   Nausea or vomiting.   Confusion.   Problems with urination, such as:   Painful or burning feeling during urination.   Decreased urine production.   Frequent accidents in children who are potty trained.   Bloody urine.   Muscle twitches and cramps.   Shortness of breath.   Seizures.   Chest pain or pressure. Sometimes, no symptoms are present. DIAGNOSIS Acute kidney injury may be detected and diagnosed by tests, including blood, urine, imaging, or kidney biopsy tests.  TREATMENT Treatment of acute kidney injury varies depending on the cause and severity of the kidney damage. In mild cases, no treatment may be needed. The kidneys may heal on their own. If acute kidney injury is more severe, your caregiver will treat the cause of the kidney damage, help the kidneys heal, and prevent complications from occurring. Severe cases may require a procedure to remove toxic wastes from the body (dialysis) or surgery to repair kidney damage. Surgery may involve:   Repair of  a torn kidney.   Removal of an obstruction. Most of the time, you will need to stay overnight at the hospital.  HOME CARE INSTRUCTIONS:  Follow your prescribed diet.  Only take over-the-counter or prescription medicines as directed by your caregiver.  Do not take any new medicines (prescription, over-the-counter, or nutritional supplements) unless approved by your caregiver. Many medicines can worsen your kidney damage or need to have the dose adjusted.   Keep all follow-up  appointments as directed by your caregiver.  Observe your condition to make sure you are healing as expected. SEEK IMMEDIATE MEDICAL CARE IF:  You are feeling ill or have severe pain in the back or side.   Your symptoms return or you have new symptoms.  You have any symptoms of end-stage kidney disease. These include:   Persistent itchiness.   Loss of appetite.   Headaches.   Abnormally dark or light skin.  Numbness in the hands or feet.   Easy bruising.   Frequent hiccups.   Menstruation stops.   You have a fever.  You have increased urine production.  You have pain or bleeding when urinating. MAKE SURE YOU:   Understand these instructions.  Will watch your condition.  Will get help right away if you are not doing well or get worse Document Released: 07/11/2010 Document Revised: 04/22/2012 Document Reviewed: 08/25/2011 [facility] Patient Information 2015 [place], [place]. This information is not intended to replace advice given to you by your health care provider. Make sure you discuss any questions you have with your health care provider.

## 2014-07-24 NOTE — Progress Notes (Signed)
Assessment and Plan:  1. ADD -  Continue ADD medication, helps with focus, no AE's. The patient was counseled on the addictive nature of the medication and was encouraged to take drug holidays when not needed.   2. Cholesterol -Continue diet and exercise. Check cholesterol.   3. . Vitamin D Def - check level and continue medications.   4. Abnormal kidney function Check labs  Continue diet and meds as discussed. Further disposition pending results of labs. Over 30 minutes of exam, counseling, chart review, and critical decision making was performed  HPI 34 y.o. female  presents for 3 month follow up on hypertension, cholesterol, prediabetes, and vitamin D deficiency.   Her blood pressure has been controlled at home, today their BP is BP: 102/68 mmHg  She does workout. She denies chest pain, shortness of breath, dizziness.  She is not on cholesterol medication and denies myalgias. Her cholesterol is at goal. The cholesterol last visit was:   Lab Results  Component Value Date   CHOL 176 12/25/2013   HDL 64 12/25/2013   LDLCALC 100* 12/25/2013   TRIG 61 12/25/2013   CHOLHDL 2.8 12/25/2013   Patient is on Vitamin D supplement, added vitamin D last visit 4000 IU last visit. .   Lab Results  Component Value Date   VD25OH 38 12/25/2013     Patient is on an ADD medication, she states that the medication is helping and she denies any adverse reactions.  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  States that irreg menses has been better since after school.  She had abnormal kidney function last visit, does admit to not drink much water.   Current Medications:  Current Outpatient Prescriptions on File Prior to Visit  Medication Sig Dispense Refill  . amphetamine-dextroamphetamine (ADDERALL) 20 MG tablet Take 1/2 to 1 tablet 1 to 2 x day if needed for ADD 60 tablet 0   No current facility-administered medications on file prior to  visit.   Medical History:  Past Medical History  Diagnosis Date  . MTHFR (methylene THF reductase) deficiency and homocystinuria   . Preterm labor   . History of dysplastic nevus   . ADD (attention deficit disorder)   . Anxiety    Allergies: No Known Allergies   Review of Systems:  Review of Systems  Constitutional: Negative.   HENT: Negative.   Eyes: Negative.   Respiratory: Negative.   Cardiovascular: Negative.   Gastrointestinal: Negative.   Genitourinary: Negative.   Musculoskeletal: Negative.   Skin: Negative.   Neurological: Negative.   Endo/Heme/Allergies: Negative.   Psychiatric/Behavioral: Negative.     Family history- Review and unchanged Social history- Review and unchanged Physical Exam: BP 102/68 mmHg  Pulse 60  Temp(Src) 97.7 F (36.5 C)  Resp 16  Ht 5\' 7"  (1.702 m)  Wt 137 lb (62.143 kg)  BMI 21.45 kg/m2 Wt Readings from Last 3 Encounters:  07/24/14 137 lb (62.143 kg)  12/25/13 137 lb (62.143 kg)  12/25/12 153 lb (69.4 kg)   General Appearance: Well nourished, in no apparent distress. Eyes: PERRLA, EOMs, conjunctiva no swelling or erythema Sinuses: No Frontal/maxillary tenderness ENT/Mouth: Ext aud canals clear, TMs without erythema, bulging. No erythema, swelling, or exudate on post pharynx.  Tonsils not swollen or erythematous. Hearing normal.  Neck: Supple, thyroid normal.  Respiratory: Respiratory effort normal, BS equal bilaterally without rales, rhonchi, wheezing or stridor.  Cardio: RRR with no MRGs.  Brisk peripheral pulses without edema.  Abdomen: Soft, + BS,  Non tender, no guarding, rebound, hernias, masses. Lymphatics: Non tender without lymphadenopathy.  Musculoskeletal: Full ROM, 5/5 strength, Normal gait Skin: Warm, dry without rashes, lesions, ecchymosis.  Neuro: Cranial nerves intact. Normal muscle tone, no cerebellar symptoms. Psych: Awake and oriented X 3, normal affect, Insight and Judgment appropriate.    [name],  PA-C 8:46 AM [facility]

## 2014-07-25 LAB — TSH: TSH: 2.026 u[IU]/mL (ref 0.350–4.500)

## 2014-07-25 LAB — URINALYSIS, ROUTINE W REFLEX MICROSCOPIC
Bilirubin Urine: NEGATIVE
Glucose, UA: NEGATIVE mg/dL
Hgb urine dipstick: NEGATIVE
KETONES UR: NEGATIVE mg/dL
Leukocytes, UA: NEGATIVE
Nitrite: NEGATIVE
Protein, ur: NEGATIVE mg/dL
Specific Gravity, Urine: 1.025 (ref 1.005–1.030)
Urobilinogen, UA: 0.2 mg/dL (ref 0.0–1.0)
pH: 5.5 (ref 5.0–8.0)

## 2014-07-25 LAB — MICROALBUMIN / CREATININE URINE RATIO
CREATININE, URINE: 352.3 mg/dL
Microalb Creat Ratio: 3.4 mg/g (ref 0.0–30.0)
Microalb, Ur: 1.2 mg/dL (ref <2.0)

## 2014-07-25 LAB — VITAMIN D 25 HYDROXY (VIT D DEFICIENCY, FRACTURES): Vit D, 25-Hydroxy: 41 ng/mL (ref 30–100)

## 2014-07-30 ENCOUNTER — Ambulatory Visit: Payer: Self-pay | Admitting: Physician Assistant

## 2014-08-17 ENCOUNTER — Encounter: Payer: Self-pay | Admitting: Physician Assistant

## 2014-08-17 ENCOUNTER — Ambulatory Visit (INDEPENDENT_AMBULATORY_CARE_PROVIDER_SITE_OTHER): Payer: BLUE CROSS/BLUE SHIELD | Admitting: Physician Assistant

## 2014-08-17 VITALS — BP 106/62 | HR 64 | Temp 97.9°F | Resp 16 | Ht 67.0 in | Wt 146.2 lb

## 2014-08-17 DIAGNOSIS — N289 Disorder of kidney and ureter, unspecified: Secondary | ICD-10-CM

## 2014-08-17 NOTE — Progress Notes (Signed)
   Subjective:    Patient ID: Jasmine Combs, female    DOB: 1980-03-12, 34 y.o.   MRN: [id number]  HPI 34 y.o. non smoking WF with history of ADD presents for abnormal kidney function. She admits to not drinking much at work but since her last labs has been drinking 100 oz a day. Has some dizziness after visiting [place] but not normal for, occ headaches but states due to not sleeping/kids. Denies joint pain, rashes, HA, lower back pain, Diarrhea, constipation, N/V. No family history of CKD, cancer. No NSAIDS  Blood pressure 106/62, pulse 64, temperature 97.9 F (36.6 C), resp. rate 16, height  (1.702 m), weight 146 lb 3.2 oz (66.316 kg).   Current Outpatient Prescriptions on File Prior to Visit  Medication Sig Dispense Refill  . amphetamine-dextroamphetamine (ADDERALL) 20 MG tablet Take 1/2 to 1 tablet 1 to 2 x day if needed for ADD 60 tablet 0   No current facility-administered medications on file prior to visit.   Past Medical History  Diagnosis Date  . MTHFR (methylene THF reductase) deficiency and homocystinuria   . Preterm labor   . History of dysplastic nevus   . ADD (attention deficit disorder)   . Anxiety     Review of Systems  Constitutional: Negative.   HENT: Negative.   Respiratory: Negative.   Cardiovascular: Negative.   Gastrointestinal: Negative.   Genitourinary: Negative.   Musculoskeletal: Negative.   Skin: Negative.   Neurological: Negative.   Hematological: Negative.   Psychiatric/Behavioral: Negative.        Objective:   Physical Exam  Constitutional: She is oriented to person, place, and time. She appears well-developed and well-nourished.  HENT:  Head: Normocephalic and atraumatic.  Right Ear: External ear normal.  Left Ear: External ear normal.  Mouth/Throat: Oropharynx is clear and moist.  Eyes: Conjunctivae and EOM are normal. Pupils are equal, round, and reactive to light.  Neck: Normal range of motion. Neck supple. No thyromegaly  present.  Cardiovascular: Normal rate, regular rhythm and normal heart sounds.  Exam reveals no gallop and no friction rub.   No murmur heard. Pulmonary/Chest: Effort normal and breath sounds normal. No respiratory distress. She has no wheezes.  Abdominal: Soft. Bowel sounds are normal. She exhibits no distension and no mass. There is no tenderness. There is no rebound and no guarding.  Musculoskeletal: Normal range of motion.  Lymphadenopathy:    She has no cervical adenopathy.  Neurological: She is alert and oriented to person, place, and time. She displays normal reflexes. No cranial nerve deficit. Coordination normal.  Skin: Skin is warm and dry.  Psychiatric: She has a normal mood and affect.      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 And get US kidney's.   Future Appointments Date Time Provider Department Center  01/05/2015 9:00 AM [name], PA-C [id number] None

## 2014-08-17 NOTE — Patient Instructions (Addendum)
Chronic Kidney Disease °Chronic kidney disease occurs when the kidneys are damaged over a long period. The kidneys are two organs that lie on either side of the spine between the middle of the back and the front of the abdomen. The kidneys:  °· Remove wastes and extra water from the blood.   °· Produce important hormones. These help keep bones strong, regulate blood pressure, and help create red blood cells.   °· Balance the fluids and chemicals in the blood and tissues. °A small amount of kidney damage may not cause problems, but a large amount of damage may make it difficult or impossible for the kidneys to work the way they should. If steps are not taken to slow down the kidney damage or stop it from getting worse, the kidneys may stop working permanently. Most of the time, chronic kidney disease does not go away. However, it can often be controlled, and those with the disease can usually live normal lives. °CAUSES  °The most common causes of chronic kidney disease are diabetes and high blood pressure (hypertension). Chronic kidney disease may also be caused by:  °· Diseases that cause the kidneys' filters to become inflamed.   °· Diseases that affect the immune system.   °· Genetic diseases.   °· Medicines that damage the kidneys, such as anti-inflammatory medicines.   °· Poisoning or exposure to toxic substances.   °· A reoccurring kidney or urinary infection.   °· A problem with urine flow. This may be caused by:   °¨ Cancer.   °¨ Kidney stones.   °¨ An enlarged prostate in males. °SIGNS AND SYMPTOMS  °Because the kidney damage in chronic kidney disease occurs slowly, symptoms develop slowly and may not be obvious until the kidney damage becomes severe. A person may have a kidney disease for years without showing any symptoms. Symptoms can include:  °· Swelling (edema) of the legs, ankles, or feet.   °· Tiredness (lethargy).   °· Nausea or vomiting.   °· Confusion.   °· Problems with urination, such as:    °¨ Decreased urine production.   °¨ Frequent urination, especially at night.   °¨ Frequent accidents in children who are potty trained.   °· Muscle twitches and cramps.   °· Shortness of breath.  °· Weakness.   °· Persistent itchiness.   °· Loss of appetite. °· Metallic taste in the mouth. °· Trouble sleeping. °· Slowed development in children. °· Short stature in children. °DIAGNOSIS  °Chronic kidney disease may be detected and diagnosed by tests, including blood, urine, imaging, or kidney biopsy tests.  °TREATMENT  °Most chronic kidney diseases cannot be cured. Treatment usually involves relieving symptoms and preventing or slowing the progression of the disease. Treatment may include:  °· A special diet. You may need to avoid alcohol and foods that are salty and high in potassium.   °· Medicines. These may:   °¨ Lower blood pressure.   °¨ Relieve anemia.   °¨ Relieve swelling.   °¨ Protect the bones. °HOME CARE INSTRUCTIONS  °· Follow your prescribed diet.   °· Take medicines only as directed by your health care provider. Do not take any new medicines (prescription, over-the-counter, or nutritional supplements) unless approved by your health care provider. Many medicines can worsen your kidney damage or need to have the dose adjusted.   °· Quit smoking if you smoke. Talk to your health care provider about a smoking cessation program.   °· Keep all follow-up visits as directed by your health care provider. °SEEK IMMEDIATE MEDICAL CARE IF: °· Your symptoms get worse or you develop new symptoms.   °· You develop symptoms of end-stage kidney disease. These   include:   °¨ Headaches.   °¨ Abnormally dark or light skin.   °¨ Numbness in the hands or feet.   °¨ Easy bruising.   °¨ Frequent hiccups.   °¨ Menstruation stops.   °· You have a fever.   °· You have decreased urine production.   °· You have pain or bleeding when urinating. °MAKE SURE YOU: °· Understand these instructions. °· Will watch your condition. °· Will  get help right away if you are not doing well or get worse. °FOR MORE INFORMATION  °· American Association of Kidney Patients: www.aakp.org °· National Kidney Foundation: www.kidney.org °· American Kidney Fund: www.akfinc.org °· Life Options Rehabilitation Program: www.lifeoptions.org and www.kidneyschool.org °Document Released: 10/05/2007 Document Revised: 05/12/2013 Document Reviewed: 08/25/2011 °ExitCare® Patient Information ©2015 ExitCare, LLC. This information is not intended to replace advice given to you by your health care provider. Make sure you discuss any questions you have with your health care provider. ° °

## 2014-08-18 DIAGNOSIS — N289 Disorder of kidney and ureter, unspecified: Secondary | ICD-10-CM

## 2014-08-18 LAB — BASIC METABOLIC PANEL WITH GFR
BUN: 15 mg/dL (ref 7–25)
CHLORIDE: 104 mmol/L (ref 98–110)
CO2: 27 mmol/L (ref 20–31)
Calcium: 9.4 mg/dL (ref 8.6–10.2)
Creat: 1.04 mg/dL (ref 0.50–1.10)
GFR, Est African American: 81 mL/min (ref >=60)
GFR, Est Non African American: 70 mL/min (ref >=60)
Glucose, Bld: 79 mg/dL (ref 65–99)
POTASSIUM: 4 mmol/L (ref 3.5–5.3)
SODIUM: 141 mmol/L (ref 135–146)

## 2014-08-18 NOTE — Addendum Note (Signed)
Addended by: [name] R on: 08/18/2014 09:40 AM   Modules accepted: Orders

## 2014-09-04 ENCOUNTER — Other Ambulatory Visit: Payer: Self-pay | Admitting: Physician Assistant

## 2014-09-04 DIAGNOSIS — F988 Other specified behavioral and emotional disorders with onset usually occurring in childhood and adolescence: Secondary | ICD-10-CM

## 2014-09-04 MED ORDER — AMPHETAMINE-DEXTROAMPHETAMINE 20 MG PO TABS: ORAL_TABLET | ORAL | Status: DC

## 2014-09-24 ENCOUNTER — Other Ambulatory Visit: Payer: Self-pay | Admitting: Physician Assistant

## 2014-09-24 ENCOUNTER — Other Ambulatory Visit: Payer: Self-pay

## 2014-11-06 ENCOUNTER — Other Ambulatory Visit: Payer: Managed Care, Other (non HMO)

## 2014-11-06 ENCOUNTER — Other Ambulatory Visit: Payer: Self-pay | Admitting: Physician Assistant

## 2014-11-06 DIAGNOSIS — N289 Disorder of kidney and ureter, unspecified: Secondary | ICD-10-CM | POA: Diagnosis not present

## 2014-11-06 DIAGNOSIS — F988 Other specified behavioral and emotional disorders with onset usually occurring in childhood and adolescence: Secondary | ICD-10-CM

## 2014-11-06 LAB — SEDIMENTATION RATE: SED RATE: 1 mm/h (ref 0–20)

## 2014-11-06 MED ORDER — AMPHETAMINE-DEXTROAMPHETAMINE 20 MG PO TABS: ORAL_TABLET | ORAL | Status: DC

## 2014-11-07 LAB — IRON AND TIBC
%SAT: 24 % (ref 11–50)
Iron: 84 ug/dL (ref 40–190)
TIBC: 350 ug/dL (ref 250–450)
UIBC: 266 ug/dL (ref 125–400)

## 2014-11-07 LAB — FERRITIN: Ferritin: 52 ng/mL (ref 10–291)

## 2014-11-07 LAB — C3 AND C4
C3 Complement: 96 mg/dL (ref 90–180)
C4 Complement: 18 mg/dL (ref 10–40)

## 2014-11-08 LAB — COMPLEMENT, TOTAL: Compl, Total (CH50): 52 U/mL (ref 31–60)

## 2014-11-09 LAB — CYCLIC CITRUL PEPTIDE ANTIBODY, IGG

## 2014-11-09 LAB — ANTI-SMITH ANTIBODY: ENA SM AB SER-ACNC: NEGATIVE

## 2014-11-09 LAB — ANA: Anti Nuclear Antibody(ANA): NEGATIVE

## 2014-11-09 LAB — ANTI-DNA ANTIBODY, DOUBLE-STRANDED: ds DNA Ab: <1 IU/mL

## 2014-11-10 LAB — LYME ABY, WSTRN BLT IGG & IGM W/BANDS
B BURGDORFERI IGG ABS (IB): NEGATIVE
B BURGDORFERI IGM ABS (IB): NEGATIVE
LYME DISEASE 18 KD IGG: NONREACTIVE
LYME DISEASE 23 KD IGM: NONREACTIVE
LYME DISEASE 39 KD IGG: NONREACTIVE
LYME DISEASE 41 KD IGG: NONREACTIVE
LYME DISEASE 45 KD IGG: NONREACTIVE
LYME DISEASE 58 KD IGG: NONREACTIVE
LYME DISEASE 66 KD IGG: NONREACTIVE
LYME DISEASE 93 KD IGG: NONREACTIVE
Lyme Disease 23 kD IgG: NONREACTIVE
Lyme Disease 28 kD IgG: NONREACTIVE
Lyme Disease 30 kD IgG: NONREACTIVE
Lyme Disease 39 kD IgM: NONREACTIVE
Lyme Disease 41 kD IgM: NONREACTIVE

## 2014-12-22 ENCOUNTER — Other Ambulatory Visit: Payer: Self-pay | Admitting: Physician Assistant

## 2014-12-22 DIAGNOSIS — F988 Other specified behavioral and emotional disorders with onset usually occurring in childhood and adolescence: Secondary | ICD-10-CM

## 2014-12-22 MED ORDER — AMPHETAMINE-DEXTROAMPHETAMINE 20 MG PO TABS: ORAL_TABLET | ORAL | Status: DC

## 2014-12-25 ENCOUNTER — Other Ambulatory Visit: Payer: Self-pay

## 2015-01-05 ENCOUNTER — Encounter: Payer: Self-pay | Admitting: Physician Assistant

## 2015-02-19 ENCOUNTER — Other Ambulatory Visit: Payer: Self-pay | Admitting: Physician Assistant

## 2015-02-19 ENCOUNTER — Ambulatory Visit (INDEPENDENT_AMBULATORY_CARE_PROVIDER_SITE_OTHER): Payer: Managed Care, Other (non HMO) | Admitting: Physician Assistant

## 2015-02-19 ENCOUNTER — Encounter: Payer: Self-pay | Admitting: Physician Assistant

## 2015-02-19 VITALS — BP 110/70 | HR 62 | Temp 97.7°F | Resp 16 | Ht 66.5 in | Wt 155.0 lb

## 2015-02-19 DIAGNOSIS — R922 Inconclusive mammogram: Secondary | ICD-10-CM

## 2015-02-19 DIAGNOSIS — Z79899 Other long term (current) drug therapy: Secondary | ICD-10-CM

## 2015-02-19 DIAGNOSIS — R1031 Right lower quadrant pain: Secondary | ICD-10-CM | POA: Diagnosis not present

## 2015-02-19 DIAGNOSIS — Z0001 Encounter for general adult medical examination with abnormal findings: Secondary | ICD-10-CM | POA: Diagnosis not present

## 2015-02-19 DIAGNOSIS — F419 Anxiety disorder, unspecified: Secondary | ICD-10-CM

## 2015-02-19 DIAGNOSIS — E559 Vitamin D deficiency, unspecified: Secondary | ICD-10-CM

## 2015-02-19 DIAGNOSIS — E785 Hyperlipidemia, unspecified: Secondary | ICD-10-CM

## 2015-02-19 DIAGNOSIS — F909 Attention-deficit hyperactivity disorder, unspecified type: Secondary | ICD-10-CM | POA: Diagnosis not present

## 2015-02-19 DIAGNOSIS — Z1389 Encounter for screening for other disorder: Secondary | ICD-10-CM

## 2015-02-19 DIAGNOSIS — N926 Irregular menstruation, unspecified: Secondary | ICD-10-CM

## 2015-02-19 DIAGNOSIS — F988 Other specified behavioral and emotional disorders with onset usually occurring in childhood and adolescence: Secondary | ICD-10-CM

## 2015-02-19 DIAGNOSIS — Z1231 Encounter for screening mammogram for malignant neoplasm of breast: Secondary | ICD-10-CM

## 2015-02-19 DIAGNOSIS — N289 Disorder of kidney and ureter, unspecified: Secondary | ICD-10-CM

## 2015-02-19 DIAGNOSIS — R6889 Other general symptoms and signs: Secondary | ICD-10-CM | POA: Diagnosis not present

## 2015-02-19 DIAGNOSIS — Z0000 Encounter for general adult medical examination without abnormal findings: Secondary | ICD-10-CM

## 2015-02-19 LAB — MAGNESIUM: MAGNESIUM: 1.9 mg/dL (ref 1.5–2.5)

## 2015-02-19 LAB — LIPID PANEL
CHOL/HDL RATIO: 2.5 ratio (ref <=5.0)
CHOLESTEROL: 213 mg/dL — AB (ref 125–200)
HDL: 86 mg/dL (ref >=46)
LDL Cholesterol: 117 mg/dL (ref <130)
TRIGLYCERIDES: 50 mg/dL (ref <150)
VLDL: 10 mg/dL (ref <30)

## 2015-02-19 LAB — HEPATIC FUNCTION PANEL
ALBUMIN: 4.4 g/dL (ref 3.6–5.1)
ALT: 17 U/L (ref 6–29)
AST: 20 U/L (ref 10–30)
Alkaline Phosphatase: 32 U/L — ABNORMAL LOW (ref 33–115)
BILIRUBIN DIRECT: 0.1 mg/dL (ref <=0.2)
BILIRUBIN TOTAL: 0.5 mg/dL (ref 0.2–1.2)
Indirect Bilirubin: 0.4 mg/dL (ref 0.2–1.2)
Total Protein: 6.9 g/dL (ref 6.1–8.1)

## 2015-02-19 LAB — CBC WITH DIFFERENTIAL/PLATELET
BASOS ABS: 0 10*3/uL (ref 0.0–0.1)
BASOS PCT: 0 % (ref 0–1)
EOS ABS: 0.2 10*3/uL (ref 0.0–0.7)
EOS PCT: 3 % (ref 0–5)
HCT: 40.5 % (ref 36.0–46.0)
Hemoglobin: 13.3 g/dL (ref 12.0–15.0)
LYMPHS ABS: 1.8 10*3/uL (ref 0.7–4.0)
Lymphocytes Relative: 34 % (ref 12–46)
MCH: 31.6 pg (ref 26.0–34.0)
MCHC: 32.8 g/dL (ref 30.0–36.0)
MCV: 96.2 fL (ref 78.0–100.0)
MONOS PCT: 9 % (ref 3–12)
MPV: 10.1 fL (ref 8.6–12.4)
Monocytes Absolute: 0.5 10*3/uL (ref 0.1–1.0)
NEUTROS PCT: 54 % (ref 43–77)
Neutro Abs: 2.9 10*3/uL (ref 1.7–7.7)
PLATELETS: 209 10*3/uL (ref 150–400)
RBC: 4.21 MIL/uL (ref 3.87–5.11)
RDW: 13 % (ref 11.5–15.5)
WBC: 5.4 10*3/uL (ref 4.0–10.5)

## 2015-02-19 LAB — BASIC METABOLIC PANEL WITH GFR
BUN: 11 mg/dL (ref 7–25)
CO2: 25 mmol/L (ref 20–31)
CREATININE: 1.17 mg/dL — AB (ref 0.50–1.10)
Calcium: 9.6 mg/dL (ref 8.6–10.2)
Chloride: 105 mmol/L (ref 98–110)
GFR, EST AFRICAN AMERICAN: 70 mL/min (ref >=60)
GFR, Est Non African American: 61 mL/min (ref >=60)
Glucose, Bld: 47 mg/dL — ABNORMAL LOW (ref 65–99)
Potassium: 4.4 mmol/L (ref 3.5–5.3)
SODIUM: 138 mmol/L (ref 135–146)

## 2015-02-19 MED ORDER — AMPHETAMINE-DEXTROAMPHETAMINE 20 MG PO TABS: ORAL_TABLET | ORAL | Status: DC

## 2015-02-19 NOTE — Progress Notes (Signed)
Complete Physical  Assessment and Plan: 1. ADD (attention deficit disorder) ADD-  Continue ADD medication, helps with focus, no AE's. The patient was counseled on the addictive nature of the medication and was encouraged to take drug holidays when not needed.  - amphetamine-dextroamphetamine (ADDERALL) 20 MG tablet; Take 1/2 to 1 tablet 1 to 2 x day if needed for ADD - Need Office Visit before further refills  Dispense: 60 tablet; Refill: 0  2. Anxiety Anxiety- continue medications, stress management techniques discussed, increase water, good sleep hygiene discussed, increase exercise, and increase veggies.  - Magnesium  3. Abnormal menses with RLQ pain - family history of ovarian cancer- get vaginal [place] - CBC with Differential - BASIC METABOLIC PANEL WITH GFR - Hepatic function panel - TSH - Urinalysis, Routine w reflex microscopic - Urine culture - Microalbumin / creatinine urine ratio  4. Hyperlipidemia - Lipid panel  5.  Vitamin D deficiency - Vit D  25 hydroxy (rtn osteoporosis monitoring)  6. Screen MGM With dense breast and family history will get screen MGM   Discussed med's effects and SE's. Screening labs and tests as requested with regular follow-up as recommended.  HPI  35 y.o. female  presents for a complete physical.   Her blood pressure has been controlled at home, today their BP is BP: 110/70 mmHg She does workout. She denies chest pain, shortness of breath, dizziness.  She has had abnormal kidney function in the past but has had normal labs, including ANA, antiDNA, complements, lyme, ferritin, iron, sed rate.  Lab Results  Component Value Date   CREATININE 1.04 08/17/2014   BUN 15 08/17/2014   NA 141 08/17/2014   K 4.0 08/17/2014   CL 104 08/17/2014   CO2 27 08/17/2014   She is not on cholesterol medication and denies myalgias. Her cholesterol is at goal. The cholesterol last visit was:   Lab Results  Component Value Date   CHOL 176 12/25/2013   HDL  64 12/25/2013   LDLCALC 100* 12/25/2013   TRIG 61 12/25/2013   CHOLHDL 2.8 12/25/2013   Follows with Dr. Jorja Loa for abnormal nevus.  [name]   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  Getting certificate now and has had promotion at work.  She is married,  has a 65 year old daughter [name] and a 38 year old son.   Current Medications:  Current Outpatient Prescriptions on File Prior to Visit  Medication Sig Dispense Refill  . amphetamine-dextroamphetamine (ADDERALL) 20 MG tablet Take 1/2 to 1 tablet 1 to 2 x day if needed for ADD 60 tablet 0   No current facility-administered medications on file prior to visit.   Health Maintenance:   Immunization History  Administered Date(s) Administered  . Tdap 12/03/2011   Tetanus: 2013 Pneumovax: Flu vaccine:2015  Zostavax: Patient's last menstrual period was 02/06/2015. feels they are coming more frequently, some minor spotting between, denies vaginal discharge/ dyspareunia.  Pap: 2015 normal, Has had abnormal pap once 3-4 years MGM: 2012, mom with stage 3 cancer at age 78, and had ovarian cancer, mom with 2 sister and GM without cancer.   DEXA: Colonoscopy: EGD: Last Dental Exam: Dr. Catha Brow Last Eye Exam: Dr. Barbara Cower- Jodelle Red, sees PA Tresa Endo   Patient Care Team: [name], MD as PCP - General (Internal  Medicine) Loraine Leriche L. Mitzi Hansen, MD as Referring Physician (Orthopedic Surgery)  Allergies: No Known Allergies Medical History:  Past Medical History  Diagnosis Date  . 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 Preterm labor   . History of dysplastic nevus   . ADD (attention deficit disorder)   . Anxiety    Surgical History:  Past Surgical History  Procedure Laterality Date  . Cesarean section     Family History:  Family History  Problem  Relation Age of Onset  . Cancer Mother 31    breast  . Hypertension Father   . Heart disease Father   . Diabetes Father   . Alcohol abuse Father   . Hyperlipidemia Father    Social History:  Social History  Substance Use Topics  . Smoking status: Never Smoker   . Smokeless tobacco: Never Used  . Alcohol Use: Yes     Comment: rare   Review of Systems: Review of Systems  Constitutional: Negative.   HENT: Negative.   Eyes: Negative.   Respiratory: Negative.   Cardiovascular: Negative.   Gastrointestinal: Positive for abdominal pain (RLQ, bloating).  Genitourinary: Negative.        Spotting, abnormal menses  Musculoskeletal: Negative.   Skin: Negative.   Neurological: Negative.   Psychiatric/Behavioral: Negative.     Physical Exam: Estimated body mass index is 24.65 kg/(m^2) as calculated from the following:   Height as of this encounter: 5' 6.5" (1.689 m).   Weight as of this encounter: 155 lb (70.308 kg). BP 110/70 mmHg  Pulse 62  Temp(Src) 97.7 F (36.5 C)  Resp 16  Ht 5' 6.5" (1.689 m)  Wt 155 lb (70.308 kg)  BMI 24.65 kg/m2  SpO2 99%  LMP 02/06/2015 General Appearance: Well nourished, in no apparent distress.  Eyes: PERRLA, EOMs, conjunctiva no swelling or erythema, normal fundi and vessels.  Sinuses: No Frontal/maxillary tenderness  ENT/Mouth: Ext aud canals clear, normal light reflex with TMs without erythema, bulging. Good dentition. no swelling, or exudate on post pharynx. Tonsils not swollen or erythematous. Hearing normal.  Neck: Supple, thyroid nodules appreciated. No bruits  Respiratory: Respiratory effort normal, BS equal bilaterally without rales, rhonchi, wheezing or stridor.  Cardio: RRR without murmurs, rubs or gallops. Brisk peripheral pulses without edema.  Chest: symmetric, with normal excursions and percussion.  Breasts: Symmetric, + mobile small lumps, 0.5cm right upper quadrant, without nipple discharge, retractions.  Abdomen: Soft, + RLQ  tenderness, flat, no guarding, rebound, hernias, masses, or organomegaly. .  Lymphatics: Non tender without lymphadenopathy.  Genitourinary: defer Musculoskeletal: Full ROM all peripheral extremities,5/5 strength, and normal gait.  Skin: Warm, dry without rashes, lesions, ecchymosis. Neuro: Cranial nerves intact, reflexes equal bilaterally. Normal muscle tone, no cerebellar symptoms. Psych: Awake and oriented X 3, normal affect, Insight and Judgment appropriate.   EKG:  declines   [name] 9:49 AM [facility]

## 2015-02-19 NOTE — Patient Instructions (Signed)
Encourage you to get the 3D Mammogram  The 3D Mammogram is much more specific and sensitive to pick up breast cancer. For women with fibrocystic breast or lumpy breast it can be hard to determine if it is cancer or not but the 3D mammogram is able to tell this difference which cuts back on unneeded additional tests or scary call backs.   - over 40% increase in detection of breast cancer - over 40% reduction in false positives.  - fewer call backs - reduced anxiety - improved outcomes - [facility]  Preventive Care for Adults A healthy lifestyle and preventive care can promote health and wellness. Preventive health guidelines for women include the following key practices.  A routine yearly physical is a good way to check with your health care provider about your health and preventive screening. It is a chance to share any concerns and updates on your health and to receive a thorough exam.  Visit your dentist for a routine exam and preventive care every 6 months. Brush your teeth twice a day and floss once a day. Good oral hygiene prevents tooth decay and gum disease.  The frequency of eye exams is based on your age, health, family medical history, use of contact lenses, and other factors. Follow your health care provider's recommendations for frequency of eye exams.  Eat a healthy diet. Foods like vegetables, fruits, whole grains, low-fat dairy products, and lean protein foods contain the nutrients you need without too many calories. Decrease your intake of foods high in solid fats, added sugars, and salt. Eat the right amount of calories for you.Get information about a proper diet from your health care provider, if necessary.  Regular physical exercise is one of the most important things you can do for your health. Most adults should get at least 150 minutes of moderate-intensity exercise (any activity that increases your heart rate and causes you to sweat) each week. In addition, most  adults need muscle-strengthening exercises on 2 or more days a week.  Maintain a healthy weight. The body mass index (BMI) is a screening tool to identify possible weight problems. It provides an estimate of body fat based on height and weight. Your health care provider can find your BMI and can help you achieve or maintain a healthy weight.For adults 20 years and older:  A BMI below 18.5 is considered underweight.  A BMI of 18.5 to 24.9 is normal.  A BMI of 25 to 29.9 is considered overweight.  A BMI of 30 and above is considered obese.  Maintain normal blood lipids and cholesterol levels by exercising and minimizing your intake of saturated fat. Eat a balanced diet with plenty of fruit and vegetables. Blood tests for lipids and cholesterol should begin at age 5 and be repeated every 5 years. If your lipid or cholesterol levels are high, you are over 50, or you are at high risk for heart disease, you may need your cholesterol levels checked more frequently.Ongoing high lipid and cholesterol levels should be treated with medicines if diet and exercise are not working.  If you smoke, find out from your health care provider how to quit. If you do not use tobacco, do not start.  Lung cancer screening is recommended for adults aged 79-80 years who are at high risk for developing lung cancer because of a history of smoking. A yearly low-dose CT scan of the lungs is recommended for people who have at least a 30-pack-year history of smoking and  are a current smoker or have quit within the past 15 years. A pack year of smoking is smoking an average of 1 pack of cigarettes a day for 1 year (for example: 1 pack a day for 30 years or 2 packs a day for 15 years). Yearly screening should continue until the smoker has stopped smoking for at least 15 years. Yearly screening should be stopped for people who develop a health problem that would prevent them from having lung cancer treatment.  If you are pregnant,  do not drink alcohol. If you are breastfeeding, be very cautious about drinking alcohol. If you are not pregnant and choose to drink alcohol, do not have more than 1 drink per day. One drink is considered to be 12 ounces (355 mL) of beer, 5 ounces (148 mL) of wine, or 1.5 ounces (44 mL) of liquor.  Avoid use of street drugs. Do not share needles with anyone. Ask for help if you need support or instructions about stopping the use of drugs.  High blood pressure causes heart disease and increases the risk of stroke. Your blood pressure should be checked at least every 1 to 2 years. Ongoing high blood pressure should be treated with medicines if weight loss and exercise do not work.  If you are 39-73 years old, ask your health care provider if you should take aspirin to prevent strokes.  Diabetes screening involves taking a blood sample to check your fasting blood sugar level. This should be done once every 3 years, after age 9, if you are within normal weight and without risk factors for diabetes. Testing should be considered at a younger age or be carried out more frequently if you are overweight and have at least 1 risk factor for diabetes.  Breast cancer screening is essential preventive care for women. You should practice "breast self-awareness." This means understanding the normal appearance and feel of your breasts and may include breast self-examination. Any changes detected, no matter how small, should be reported to a health care provider. Women in their 60s and 30s should have a clinical breast exam (CBE) by a health care provider as part of a regular health exam every 1 to 3 years. After age 53, women should have a CBE every year. Starting at age 76, women should consider having a mammogram (breast X-ray test) every year. Women who have a family history of breast cancer should talk to their health care provider about genetic screening. Women at a high risk of breast cancer should talk to their  health care providers about having an MRI and a mammogram every year.  Breast cancer gene (BRCA)-related cancer risk assessment is recommended for women who have family members with BRCA-related cancers. BRCA-related cancers include breast, ovarian, tubal, and peritoneal cancers. Having family members with these cancers may be associated with an increased risk for harmful changes (mutations) in the breast cancer genes BRCA1 and BRCA2. Results of the assessment will determine the need for genetic counseling and BRCA1 and BRCA2 testing.  Routine pelvic exams to screen for cancer are no longer recommended for nonpregnant women who are considered low risk for cancer of the pelvic organs (ovaries, uterus, and vagina) and who do not have symptoms. Ask your health care provider if a screening pelvic exam is right for you.  If you have had past treatment for cervical cancer or a condition that could lead to cancer, you need Pap tests and screening for cancer for at least 20 years after your  treatment. If Pap tests have been discontinued, your risk factors (such as having a new sexual partner) need to be reassessed to determine if screening should be resumed. Some women have medical problems that increase the chance of getting cervical cancer. In these cases, your health care provider may recommend more frequent screening and Pap tests.  The HPV test is an additional test that may be used for cervical cancer screening. The HPV test looks for the virus that can cause the cell changes on the cervix. The cells collected during the Pap test can be tested for HPV. The HPV test could be used to screen women aged 6 years and older, and should be used in women of any age who have unclear Pap test results. After the age of 51, women should have HPV testing at the same frequency as a Pap test.  Colorectal cancer can be detected and often prevented. Most routine colorectal cancer screening begins at the age of 60 years and  continues through age 29 years. However, your health care provider may recommend screening at an earlier age if you have risk factors for colon cancer. On a yearly basis, your health care provider may provide home test kits to check for hidden blood in the stool. Use of a small camera at the end of a tube, to directly examine the colon (sigmoidoscopy or colonoscopy), can detect the earliest forms of colorectal cancer. Talk to your health care provider about this at age 32, when routine screening begins. Direct exam of the colon should be repeated every 5-10 years through age 34 years, unless early forms of pre-cancerous polyps or small growths are found.  People who are at an increased risk for hepatitis B should be screened for this virus. You are considered at high risk for hepatitis B if:  You were born in a country where hepatitis B occurs often. Talk with your health care provider about which countries are considered high risk.  Your parents were born in a high-risk country and you have not received a shot to protect against hepatitis B (hepatitis B vaccine).  You have HIV or AIDS.  You use needles to inject street drugs.  You live with, or have sex with, someone who has hepatitis B.  You get hemodialysis treatment.  You take certain medicines for conditions like cancer, organ transplantation, and autoimmune conditions.  Hepatitis C blood testing is recommended for all people born from [birth year] through 1965 and any individual with known risks for hepatitis C.  Practice safe sex. Use condoms and avoid high-risk sexual practices to reduce the spread of sexually transmitted infections (STIs). STIs include gonorrhea, chlamydia, syphilis, trichomonas, herpes, HPV, and human immunodeficiency virus (HIV). Herpes, HIV, and HPV are viral illnesses that have no cure. They can result in disability, cancer, and death.  You should be screened for sexually transmitted illnesses (STIs) including gonorrhea  and chlamydia if:  You are sexually active and are younger than 24 years.  You are older than 24 years and your health care provider tells you that you are at risk for this type of infection.  Your sexual activity has changed since you were last screened and you are at an increased risk for chlamydia or gonorrhea. Ask your health care provider if you are at risk.  If you are at risk of being infected with HIV, it is recommended that you take a prescription medicine daily to prevent HIV infection. This is called preexposure prophylaxis (PrEP). You are considered at  risk if:  You are a heterosexual woman, are sexually active, and are at increased risk for HIV infection.  You take drugs by injection.  You are sexually active with a partner who has HIV.  Talk with your health care provider about whether you are at high risk of being infected with HIV. If you choose to begin PrEP, you should first be tested for HIV. You should then be tested every 3 months for as long as you are taking PrEP.  Osteoporosis is a disease in which the bones lose minerals and strength with aging. This can result in serious bone fractures or breaks. The risk of osteoporosis can be identified using a bone density scan. Women ages 60 years and over and women at risk for fractures or osteoporosis should discuss screening with their health care providers. Ask your health care provider whether you should take a calcium supplement or vitamin D to reduce the rate of osteoporosis.  Menopause can be associated with physical symptoms and risks. Hormone replacement therapy is available to decrease symptoms and risks. You should talk to your health care provider about whether hormone replacement therapy is right for you.  Use sunscreen. Apply sunscreen liberally and repeatedly throughout the day. You should seek shade when your shadow is shorter than you. Protect yourself by wearing long sleeves, pants, a wide-brimmed hat, and  sunglasses year round, whenever you are outdoors.  Once a month, do a whole body skin exam, using a mirror to look at the skin on your back. Tell your health care provider of new moles, moles that have irregular borders, moles that are larger than a pencil eraser, or moles that have changed in shape or color.  Stay current with required vaccines (immunizations).  Influenza vaccine. All adults should be immunized every year.  Tetanus, diphtheria, and acellular pertussis (Td, Tdap) vaccine. Pregnant women should receive 1 dose of Tdap vaccine during each pregnancy. The dose should be obtained regardless of the length of time since the last dose. Immunization is preferred during the 27th-36th week of gestation. An adult who has not previously received Tdap or who does not know her vaccine status should receive 1 dose of Tdap. This initial dose should be followed by tetanus and diphtheria toxoids (Td) booster doses every 10 years. Adults with an unknown or incomplete history of completing a 3-dose immunization series with Td-containing vaccines should begin or complete a primary immunization series including a Tdap dose. Adults should receive a Td booster every 10 years.  Varicella vaccine. An adult without evidence of immunity to varicella should receive 2 doses or a second dose if she has previously received 1 dose. Pregnant females who do not have evidence of immunity should receive the first dose after pregnancy. This first dose should be obtained before leaving the health care facility. The second dose should be obtained 4-8 weeks after the first dose.  Human papillomavirus (HPV) vaccine. Females aged 13-26 years who have not received the vaccine previously should obtain the 3-dose series. The vaccine is not recommended for use in pregnant females. However, pregnancy testing is not needed before receiving a dose. If a female is found to be pregnant after receiving a dose, no treatment is needed. In that  case, the remaining doses should be delayed until after the pregnancy. Immunization is recommended for any person with an immunocompromised condition through the age of 67 years if she did not get any or all doses earlier. During the 3-dose series, the second dose  should be obtained 4-8 weeks after the first dose. The third dose should be obtained 24 weeks after the first dose and 16 weeks after the second dose.  Zoster vaccine. One dose is recommended for adults aged 34 years or older unless certain conditions are present.  Measles, mumps, and rubella (MMR) vaccine. Adults born before [birth year] generally are considered immune to measles and mumps. Adults born in [birth year] or later should have 1 or more doses of MMR vaccine unless there is a contraindication to the vaccine or there is laboratory evidence of immunity to each of the three diseases. A routine second dose of MMR vaccine should be obtained at least 28 days after the first dose for students attending postsecondary schools, health care workers, or international travelers. People who received inactivated measles vaccine or an unknown type of measles vaccine during 1963-1967 should receive 2 doses of MMR vaccine. People who received inactivated mumps vaccine or an unknown type of mumps vaccine before 1979 and are at high risk for mumps infection should consider immunization with 2 doses of MMR vaccine. For females of childbearing age, rubella immunity should be determined. If there is no evidence of immunity, females who are not pregnant should be vaccinated. If there is no evidence of immunity, females who are pregnant should delay immunization until after pregnancy. Unvaccinated health care workers born before [birth year] who lack laboratory evidence of measles, mumps, or rubella immunity or laboratory confirmation of disease should consider measles and mumps immunization with 2 doses of MMR vaccine or rubella immunization with 1 dose of MMR vaccine.  Pneumococcal  13-valent conjugate (PCV13) vaccine. When indicated, a person who is uncertain of her immunization history and has no record of immunization should receive the PCV13 vaccine. An adult aged 47 years or older who has certain medical conditions and has not been previously immunized should receive 1 dose of PCV13 vaccine. This PCV13 should be followed with a dose of pneumococcal polysaccharide (PPSV23) vaccine. The PPSV23 vaccine dose should be obtained at least 8 weeks after the dose of PCV13 vaccine. An adult aged 1 years or older who has certain medical conditions and previously received 1 or more doses of PPSV23 vaccine should receive 1 dose of PCV13. The PCV13 vaccine dose should be obtained 1 or more years after the last PPSV23 vaccine dose.  Pneumococcal polysaccharide (PPSV23) vaccine. When PCV13 is also indicated, PCV13 should be obtained first. All adults aged 64 years and older should be immunized. An adult younger than age 40 years who has certain medical conditions should be immunized. Any person who resides in a nursing home or long-term care facility should be immunized. An adult smoker should be immunized. People with an immunocompromised condition and certain other conditions should receive both PCV13 and PPSV23 vaccines. People with human immunodeficiency virus (HIV) infection should be immunized as soon as possible after diagnosis. Immunization during chemotherapy or radiation therapy should be avoided. Routine use of PPSV23 vaccine is not recommended for American Indians, [place] Natives, or people younger than 65 years unless there are medical conditions that require PPSV23 vaccine. When indicated, people who have unknown immunization and have no record of immunization should receive PPSV23 vaccine. One-time revaccination 5 years after the first dose of PPSV23 is recommended for people aged 19-64 years who have chronic kidney failure, nephrotic syndrome, asplenia, or immunocompromised conditions.  People who received 1-2 doses of PPSV23 before age 73 years should receive another dose of PPSV23 vaccine at age 67 years or later if  at least 5 years have passed since the previous dose. Doses of PPSV23 are not needed for people immunized with PPSV23 at or after age 97 years.  Meningococcal vaccine. Adults with asplenia or persistent complement component deficiencies should receive 2 doses of quadrivalent meningococcal conjugate (MenACWY-D) vaccine. The doses should be obtained at least 2 months apart. Microbiologists working with certain meningococcal bacteria, [place] recruits, people at risk during an outbreak, and people who travel to or live in countries with a high rate of meningitis should be immunized. A first-year college student up through age 88 years who is living in a residence hall should receive a dose if she did not receive a dose on or after her 16th birthday. Adults who have certain high-risk conditions should receive one or more doses of vaccine.  Hepatitis A vaccine. Adults who wish to be protected from this disease, have certain high-risk conditions, work with hepatitis A-infected animals, work in hepatitis A research labs, or travel to or work in countries with a high rate of hepatitis A should be immunized. Adults who were previously unvaccinated and who anticipate close contact with an international adoptee during the first 60 days after arrival in the [place] from a country with a high rate of hepatitis A should be immunized.  Hepatitis B vaccine. Adults who wish to be protected from this disease, have certain high-risk conditions, may be exposed to blood or other infectious body fluids, are household contacts or sex partners of hepatitis B positive people, are clients or workers in certain care facilities, or travel to or work in countries with a high rate of hepatitis B should be immunized.  Haemophilus influenzae type b (Hib) vaccine. A previously unvaccinated person with  asplenia or sickle cell disease or having a scheduled splenectomy should receive 1 dose of Hib vaccine. Regardless of previous immunization, a recipient of a hematopoietic stem cell transplant should receive a 3-dose series 6-12 months after her successful transplant. Hib vaccine is not recommended for adults with HIV infection. Preventive Services / Frequency  Ages 52 to 35 years 1. Blood pressure check. 2. Lipid and cholesterol check. 3. Clinical breast exam.** / Every 3 years for women in their 82s and 55s. 4. BRCA-related cancer risk assessment.** / For women who have family members with a BRCA-related cancer (breast, ovarian, tubal, or peritoneal cancers). 5. Pap test.** / Every 2 years from ages 36 through 95. Every 3 years starting at age 72 through age 21 or 22 with a history of 3 consecutive normal Pap tests. 6. HPV screening.** / Every 3 years from ages 68 through ages 73 to 51 with a history of 3 consecutive normal Pap tests. 7. Hepatitis C blood test.** / For any individual with known risks for hepatitis C. 8. Skin self-exam. / Monthly. 9. Influenza vaccine. / Every year. 10. Tetanus, diphtheria, and acellular pertussis (Tdap, Td) vaccine.** / Consult your health care provider. Pregnant women should receive 1 dose of Tdap vaccine during each pregnancy. 1 dose of Td every 10 years. 11. Varicella vaccine.** / Consult your health care provider. Pregnant females who do not have evidence of immunity should receive the first dose after pregnancy. 12. HPV vaccine. / 3 doses over 6 months, if 11 and younger. The vaccine is not recommended for use in pregnant females. However, pregnancy testing is not needed before receiving a dose. 13. Measles, mumps, rubella (MMR) vaccine.** / You need at least 1 dose of MMR if you were born in 1957 or later.  You may also need a 2nd dose. For females of childbearing age, rubella immunity should be determined. If there is no evidence of immunity, females who are  not pregnant should be vaccinated. If there is no evidence of immunity, females who are pregnant should delay immunization until after pregnancy. 14. Pneumococcal 13-valent conjugate (PCV13) vaccine.** / Consult your health care provider. 15. Pneumococcal polysaccharide (PPSV23) vaccine.** / 1 to 2 doses if you smoke cigarettes or if you have certain conditions. 16. Meningococcal vaccine.** / 1 dose if you are age 77 to 26 years and a Market researcher living in a residence hall, or have one of several medical conditions, you need to get vaccinated against meningococcal disease. You may also need additional booster doses. 17. Hepatitis A vaccine.** / Consult your health care provider. 18. Hepatitis B vaccine.** / Consult your health care provider. 19. Haemophilus influenzae type b (Hib) vaccine.** / Consult your health care provider.  54. Chlamydia, HIV, and other sexually transmitted diseases- Get screened every year until age 4, then within three months of each new sexual provider. 21. Pap Smear- Every 1-3 years; discuss with your health care provider. 91. Mammogram- Every year starting at age 76  Take these steps 1. Do not smoke-Your healthcare provider can help you quit.  For tips on how to quit go to www.smokefree.gov or call 1-800 QUITNOW. 2. Be physically active- Exercise 5 days a week for at least 30 minutes.  If you are not already physically active, start slow and gradually work up to 30 minutes of moderate physical activity.  Examples of moderate activity include walking briskly, dancing, swimming, bicycling, etc. 3. Breast Cancer- A self breast exam every month is important for early detection of breast cancer.  For more information and instruction on self breast exams, ask your healthcare provider or https://www.patel.info/. 4. Eat a healthy diet- Eat a variety of healthy foods such as fruits, vegetables, whole grains, low fat milk, low fat cheeses,  yogurt, lean meats, poultry and fish, beans, nuts, tofu, etc.  For more information go to www. Thenutritionsource.org 5. Drink alcohol in moderation- Limit alcohol intake to one drink or less per day. Never drink and drive. 6. Depression- Your emotional health is as important as your physical health.  If you're feeling down or losing interest in things you normally enjoy please talk to your healthcare provider about being screened for depression. 7. Dental visit- Brush and floss your teeth twice daily; visit your dentist twice a year. 8. Eye doctor- Get an eye exam at least every 2 years. 9. Helmet use- Always wear a helmet when riding a bicycle, motorcycle, rollerblading or skateboarding. 57. Safe sex- If you may be exposed to sexually transmitted infections, use a condom. 11. Seat belts- Seat belts can save your live; always wear one. 12. Smoke/Carbon Monoxide detectors- These detectors need to be installed on the appropriate level of your home. Replace batteries at least once a year. 13. Skin cancer- When out in the sun please cover up and use sunscreen 15 SPF or higher. 14. Violence- If anyone is threatening or hurting you, please tell your healthcare provider.

## 2015-02-20 LAB — MICROALBUMIN / CREATININE URINE RATIO: Creatinine, Urine: 44 mg/dL (ref 20–320)

## 2015-02-20 LAB — TSH: TSH: 1.83 m[IU]/L

## 2015-02-20 LAB — URINALYSIS, ROUTINE W REFLEX MICROSCOPIC
Bilirubin Urine: NEGATIVE
Glucose, UA: NEGATIVE
HGB URINE DIPSTICK: NEGATIVE
Ketones, ur: NEGATIVE
LEUKOCYTES UA: NEGATIVE
NITRITE: NEGATIVE
PH: 6 (ref 5.0–8.0)
Protein, ur: NEGATIVE
SPECIFIC GRAVITY, URINE: 1.006 (ref 1.001–1.035)

## 2015-02-20 LAB — VITAMIN D 25 HYDROXY (VIT D DEFICIENCY, FRACTURES): Vit D, 25-Hydroxy: 15 ng/mL — ABNORMAL LOW (ref 30–100)

## 2015-03-01 ENCOUNTER — Ambulatory Visit: Payer: Self-pay

## 2015-03-01 ENCOUNTER — Ambulatory Visit
Admission: RE | Admit: 2015-03-01 | Discharge: 2015-03-01 | Disposition: A | Discharge status: Home / Self Care | Payer: Managed Care, Other (non HMO) | Source: Ambulatory Visit | Attending: Physician Assistant | Admitting: Physician Assistant

## 2015-03-01 DIAGNOSIS — Z1231 Encounter for screening mammogram for malignant neoplasm of breast: Secondary | ICD-10-CM

## 2015-03-01 DIAGNOSIS — R922 Inconclusive mammogram: Secondary | ICD-10-CM

## 2015-03-05 ENCOUNTER — Ambulatory Visit: Payer: Self-pay

## 2015-04-01 ENCOUNTER — Other Ambulatory Visit: Payer: Self-pay | Admitting: Physician Assistant

## 2015-04-01 DIAGNOSIS — F988 Other specified behavioral and emotional disorders with onset usually occurring in childhood and adolescence: Secondary | ICD-10-CM

## 2015-04-01 MED ORDER — AMPHETAMINE-DEXTROAMPHETAMINE 20 MG PO TABS: ORAL_TABLET | ORAL | Status: DC

## 2015-05-10 ENCOUNTER — Telehealth: Payer: Self-pay | Admitting: Internal Medicine

## 2015-05-10 MED ORDER — PREDNISONE 20 MG PO TABS: ORAL_TABLET | ORAL | Status: DC

## 2015-05-10 MED ORDER — FLUTICASONE PROPIONATE 50 MCG/ACT NA SUSP: 2.0000 | Freq: Every day | NASAL | Status: DC

## 2015-05-10 NOTE — Telephone Encounter (Signed)
Patient calling with sinus pressure, congestion, colored nasal mucous, and post nasal drainage.  No appointments available.  WIll send in flonase, prednisone, recommend sudafed TID, nasal saline, and daily zyrtec.  If no better in 3-5 days to come in for appointment.

## 2015-05-13 ENCOUNTER — Other Ambulatory Visit: Payer: Self-pay | Admitting: Physician Assistant

## 2015-05-13 DIAGNOSIS — F988 Other specified behavioral and emotional disorders with onset usually occurring in childhood and adolescence: Secondary | ICD-10-CM

## 2015-05-13 MED ORDER — AMPHETAMINE-DEXTROAMPHETAMINE 20 MG PO TABS: ORAL_TABLET | ORAL | Status: DC

## 2015-06-06 ENCOUNTER — Other Ambulatory Visit: Payer: Self-pay | Admitting: Internal Medicine

## 2015-06-17 ENCOUNTER — Other Ambulatory Visit: Payer: Self-pay | Admitting: Physician Assistant

## 2015-06-17 DIAGNOSIS — F988 Other specified behavioral and emotional disorders with onset usually occurring in childhood and adolescence: Secondary | ICD-10-CM

## 2015-06-17 MED ORDER — AMPHETAMINE-DEXTROAMPHETAMINE 20 MG PO TABS: ORAL_TABLET | ORAL | Status: DC

## 2015-07-27 ENCOUNTER — Other Ambulatory Visit: Payer: Self-pay | Admitting: Physician Assistant

## 2015-07-27 DIAGNOSIS — F988 Other specified behavioral and emotional disorders with onset usually occurring in childhood and adolescence: Secondary | ICD-10-CM

## 2015-07-27 MED ORDER — AMPHETAMINE-DEXTROAMPHETAMINE 20 MG PO TABS: ORAL_TABLET | ORAL | Status: DC

## 2015-09-09 ENCOUNTER — Other Ambulatory Visit: Payer: Self-pay | Admitting: Physician Assistant

## 2015-09-09 DIAGNOSIS — F988 Other specified behavioral and emotional disorders with onset usually occurring in childhood and adolescence: Secondary | ICD-10-CM

## 2015-09-09 MED ORDER — AMPHETAMINE-DEXTROAMPHETAMINE 20 MG PO TABS: ORAL_TABLET | ORAL | 0 refills | Status: DC

## 2015-10-14 ENCOUNTER — Other Ambulatory Visit: Payer: Self-pay | Admitting: Physician Assistant

## 2015-10-14 DIAGNOSIS — F988 Other specified behavioral and emotional disorders with onset usually occurring in childhood and adolescence: Secondary | ICD-10-CM

## 2015-10-14 MED ORDER — AMPHETAMINE-DEXTROAMPHETAMINE 20 MG PO TABS: ORAL_TABLET | ORAL | 0 refills | Status: DC

## 2016-01-05 ENCOUNTER — Encounter: Payer: Self-pay | Admitting: Physician Assistant

## 2016-02-25 ENCOUNTER — Encounter: Payer: Self-pay | Admitting: Physician Assistant

## 2016-02-25 NOTE — Progress Notes (Deleted)
Complete Physical  Assessment and Plan: ADD (attention deficit disorder) ADD-  Continue ADD medication, helps with focus, no AE's. The patient was counseled on the addictive nature of the medication and was encouraged to take drug holidays when not needed.  - amphetamine-dextroamphetamine (ADDERALL) 20 MG tablet; Take 1/2 to 1 tablet 1 to 2 x day if needed for ADD - Need Office Visit before further refills  Dispense: 60 tablet; Refill: 0  Anxiety Anxiety- continue medications, stress management techniques discussed, increase water, good sleep hygiene discussed, increase exercise, and increase veggies.  - Magnesium   Hyperlipidemia - Lipid panel  Vitamin D deficiency - Vit D  25 hydroxy (rtn osteoporosis monitoring)  Screen MGM With dense breast and family history will get screen MGM   Discussed med's effects and SE's. Screening labs and tests as requested with regular follow-up as recommended.  HPI  36 y.o. female  presents for a complete physical.   Her blood pressure has been controlled at home, today their BP is   She does workout. She denies chest pain, shortness of breath, dizziness.  She has had abnormal kidney function in the past but has had normal labs, including ANA, antiDNA, complements, lyme, ferritin, iron, sed rate.  Lab Results  Component Value Date   CREATININE 1.17 (H) 02/19/2015   BUN 11 02/19/2015   NA 138 02/19/2015   K 4.4 02/19/2015   CL 105 02/19/2015   CO2 25 02/19/2015   She is not on cholesterol medication and denies myalgias. Her cholesterol is at goal. The cholesterol last visit was:   Lab Results  Component Value Date   CHOL 213 (H) 02/19/2015   HDL 86 02/19/2015   LDLCALC 117 02/19/2015   TRIG 50 02/19/2015   CHOLHDL 2.5 02/19/2015   Follows with Dr. Jorja Loa for abnormal nevus.  [name]   Lab Results  Component Value Date   VD25OH 15 (L) 02/19/2015     Has gotten her Masters in [employer] and has ADD, uses  Adderall PRN for work during the week mainly.  Getting certificate now and has had promotion at work.  She is married,  has a 68 year old daughter [name] and a 89 year old son.   Current Medications:  Current Outpatient Prescriptions on File Prior to Visit  Medication Sig Dispense Refill  . amphetamine-dextroamphetamine (ADDERALL) 20 MG tablet Take 1/2 to 1 tablet 1 to 2 x day if needed for ADD 60 tablet 0  . fluticasone (FLONASE) 50 MCG/ACT nasal spray PLACE 2 SPRAYS INTO BOTH NOSTRILS DAILY. 16 g 0  . predniSONE (DELTASONE) 20 MG tablet 3 tabs po daily x 3 days, then 2 tabs x 3 days, then 1.5 tabs x 3 days, then 1 tab x 3 days, then 0.5 tabs x 3 days 27 tablet 0   No current facility-administered medications on file prior to visit.    Health Maintenance:   Immunization History  Administered Date(s) Administered  . Tdap 12/03/2011   Tetanus: 2013 Pneumovax: Flu vaccine:2015  Zostavax:  No LMP recorded. feels they are coming more frequently, some minor spotting between, denies vaginal discharge/ dyspareunia.  Pap: 2015 normal, Has had abnormal pap once 3-4 years MGM: 2012, mom with stage 3 cancer at age 34, and had ovarian cancer, mom with 2 sister and GM without cancer.   DEXA: Colonoscopy: EGD: Last Dental Exam: Dr. Catha Brow Last Eye Exam: Dr. Barbara Cower- Jodelle Red, sees PA [facility]   Patient Care Team:  Lucky CowboyWilliam McKeown, MD as PCP - General (Internal Medicine) Kathrin PennerMark L. Mitzi HansenMoody, MD as Referring Physician (Orthopedic Surgery)  Medical History:  Past Medical History:  Diagnosis Date  . ADD (attention deficit disorder)   . Anxiety   . History of dysplastic nevus   . 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 Preterm labor    Allergies No Known Allergies  SURGICAL HISTORY She  has a past surgical history that includes Cesarean section. FAMILY HISTORY Her family history includes Alcohol abuse in her father; Cancer (age of onset: 10136) in her mother;  Diabetes in her father; Heart disease in her father; Hyperlipidemia in her father; Hypertension in her father. SOCIAL HISTORY She  reports that she has never smoked. She has never used smokeless tobacco. She reports that she drinks alcohol. She reports that she does not use drugs.  Review of Systems: Review of Systems  Constitutional: Negative.   HENT: Negative.   Eyes: Negative.   Respiratory: Negative.   Cardiovascular: Negative.   Gastrointestinal: Negative for abdominal pain.  Genitourinary: Negative.        Spotting, abnormal menses  Musculoskeletal: Negative.   Skin: Negative.   Neurological: Negative.   Psychiatric/Behavioral: Negative.     Physical Exam: Estimated body mass index is 24.64 kg/m as calculated from the following:   Height as of 02/19/15: 5' 6.5" (1.689 m).   Weight as of 02/19/15: 155 lb (70.3 kg). There were no vitals taken for this visit. General Appearance: Well nourished, in no apparent distress.  Eyes: PERRLA, EOMs, conjunctiva no swelling or erythema, normal fundi and vessels.  Sinuses: No Frontal/maxillary tenderness  ENT/Mouth: Ext aud canals clear, normal light reflex with TMs without erythema, bulging. Good dentition. no swelling, or exudate on post pharynx. Tonsils not swollen or erythematous. Hearing normal.  Neck: Supple, thyroid nodules appreciated. No bruits  Respiratory: Respiratory effort normal, BS equal bilaterally without rales, rhonchi, wheezing or stridor.  Cardio: RRR without murmurs, rubs or gallops. Brisk peripheral pulses without edema.  Chest: symmetric, with normal excursions and percussion.  Breasts: Symmetric, + mobile small lumps, 0.5cm right upper quadrant, without nipple discharge, retractions.  Abdomen: Soft, + RLQ tenderness, flat, no guarding, rebound, hernias, masses, or organomegaly. .  Lymphatics: Non tender without lymphadenopathy.  Genitourinary: defer Musculoskeletal: Full ROM all peripheral extremities,5/5 strength,  and normal gait.  Skin: Warm, dry without rashes, lesions, ecchymosis. Neuro: Cranial nerves intact, reflexes equal bilaterally. Normal muscle tone, no cerebellar symptoms. Psych: Awake and oriented X 3, normal affect, Insight and Judgment appropriate.   EKG:  declines   Quentin MullingAmanda Shequilla Goodgame 7:30 AM [facility]

## 2016-04-10 ENCOUNTER — Encounter: Payer: Self-pay | Admitting: Physician Assistant

## 2016-04-10 NOTE — Progress Notes (Deleted)
Complete Physical  Assessment and Plan:  ADD (attention deficit disorder) ADD-  Continue ADD medication, helps with focus, no AE's. The patient was counseled on the addictive nature of the medication and was encouraged to take drug holidays when not needed.  - amphetamine-dextroamphetamine (ADDERALL) 20 MG tablet; Take 1/2 to 1 tablet 1 to 2 x day if needed for ADD - Need Office Visit before further refills  Dispense: 60 tablet; Refill: 0  Anxiety Anxiety- continue medications, stress management techniques discussed, increase water, good sleep hygiene discussed, increase exercise, and increase veggies.  - Magnesium   Abnormal menses with RLQ pain - family history of ovarian cancer- get vaginal [place] - CBC with Differential - BASIC METABOLIC PANEL WITH GFR - Hepatic function panel - TSH - Urinalysis, Routine w reflex microscopic - Urine culture - Microalbumin / creatinine urine ratio  Hyperlipidemia - Lipid panel   Vitamin D deficiency - Vit D  25 hydroxy (rtn osteoporosis monitoring)   Discussed med's effects and SE's. Screening labs and tests as requested with regular follow-up as recommended.  HPI  36 y.o. female  presents for a complete physical.   Her blood pressure has been controlled at home, today their BP is   She does workout. She denies chest pain, shortness of breath, dizziness.  She has had abnormal kidney function in the past but has had normal labs, including ANA, antiDNA, complements, lyme, ferritin, iron, sed rate.  Lab Results  Component Value Date   CREATININE 1.17 (H) 02/19/2015   BUN 11 02/19/2015   NA 138 02/19/2015   K 4.4 02/19/2015   CL 105 02/19/2015   CO2 25 02/19/2015   She is not on cholesterol medication and denies myalgias. Her cholesterol is at goal. The cholesterol last visit was:   Lab Results  Component Value Date   CHOL 213 (H) 02/19/2015   HDL 86 02/19/2015   LDLCALC 117 02/19/2015   TRIG 50 02/19/2015   CHOLHDL 2.5 02/19/2015    Follows with Dr. Jorja Loa for abnormal nevus.  [name]   Lab Results  Component Value Date   VD25OH 15 (L) 02/19/2015     Has gotten her Masters in [employer] and has ADD, uses Adderall PRN for work during the week mainly.  Getting certificate now and has had promotion at work.  She is married,  has a 36 year old daughter [name] and a 38 year old son.   Current Medications:  Current Outpatient Prescriptions on File Prior to Visit  Medication Sig Dispense Refill  . amphetamine-dextroamphetamine (ADDERALL) 20 MG tablet Take 1/2 to 1 tablet 1 to 2 x day if needed for ADD 60 tablet 0  . fluticasone (FLONASE) 50 MCG/ACT nasal spray PLACE 2 SPRAYS INTO BOTH NOSTRILS DAILY. 16 g 0  . predniSONE (DELTASONE) 20 MG tablet 3 tabs po daily x 3 days, then 2 tabs x 3 days, then 1.5 tabs x 3 days, then 1 tab x 3 days, then 0.5 tabs x 3 days 27 tablet 0   No current facility-administered medications on file prior to visit.    Health Maintenance:   Immunization History  Administered Date(s) Administered  . Tdap 12/03/2011   Tetanus: 2013 Pneumovax: Flu vaccine:2015  Zostavax:  No LMP recorded. feels they are coming more frequently, some minor spotting between, denies vaginal discharge/ dyspareunia.  Pap: 2015 normal, Has had abnormal pap once 3-4 years MGM: 2012, mom with stage 3 cancer at age 25, and had ovarian  cancer, mom with 2 sister and GM without cancer.   DEXA: Colonoscopy: EGD: Last Dental Exam: Dr. Catha Brow Last Eye Exam: Dr. Barbara Cower- Jodelle Red, sees PA [facility] Endo   Patient Care Team: [name], MD as PCP - General (Internal Medicine) Kathrin Penner. Mitzi Hansen, MD as Referring Physician (Orthopedic Surgery)  Medical History:  Past Medical History:  Diagnosis Date  . ADD (attention deficit disorder)   . Anxiety   . History of dysplastic nevus   . 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 Preterm labor    Allergies No Known  Allergies  SURGICAL HISTORY She  has a past surgical history that includes Cesarean section. FAMILY HISTORY Her family history includes Alcohol abuse in her father; Cancer (age of onset: 29) in her mother; Diabetes in her father; Heart disease in her father; Hyperlipidemia in her father; Hypertension in her father. SOCIAL HISTORY She  reports that she has never smoked. She has never used smokeless tobacco. She reports that she drinks alcohol. She reports that she does not use drugs.  Review of Systems: Review of Systems  Constitutional: Negative.   HENT: Negative.   Eyes: Negative.   Respiratory: Negative.   Cardiovascular: Negative.   Gastrointestinal: Positive for abdominal pain (RLQ, bloating).  Genitourinary: Negative.        Spotting, abnormal menses  Musculoskeletal: Negative.   Skin: Negative.   Neurological: Negative.   Psychiatric/Behavioral: Negative.     Physical Exam: Estimated body mass index is 24.64 kg/m as calculated from the following:   Height as of 02/19/15: 5' 6.5" (1.689 m).   Weight as of 02/19/15: 155 lb (70.3 kg). There were no vitals taken for this visit. General Appearance: Well nourished, in no apparent distress.  Eyes: PERRLA, EOMs, conjunctiva no swelling or erythema, normal fundi and vessels.  Sinuses: No Frontal/maxillary tenderness  ENT/Mouth: Ext aud canals clear, normal light reflex with TMs without erythema, bulging. Good dentition. no swelling, or exudate on post pharynx. Tonsils not swollen or erythematous. Hearing normal.  Neck: Supple, thyroid nodules appreciated. No bruits  Respiratory: Respiratory effort normal, BS equal bilaterally without rales, rhonchi, wheezing or stridor.  Cardio: RRR without murmurs, rubs or gallops. Brisk peripheral pulses without edema.  Chest: symmetric, with normal excursions and percussion.  Breasts: Symmetric, + mobile small lumps, 0.5cm right upper quadrant, without nipple discharge, retractions.  Abdomen:  Soft, + RLQ tenderness, flat, no guarding, rebound, hernias, masses, or organomegaly. .  Lymphatics: Non tender without lymphadenopathy.  Genitourinary: defer Musculoskeletal: Full ROM all peripheral extremities,5/5 strength, and normal gait.  Skin: Warm, dry without rashes, lesions, ecchymosis. Neuro: Cranial nerves intact, reflexes equal bilaterally. Normal muscle tone, no cerebellar symptoms. Psych: Awake and oriented X 3, normal affect, Insight and Judgment appropriate.   EKG:  declines   [name] 9:35 AM [facility]

## 2016-04-18 ENCOUNTER — Other Ambulatory Visit (HOSPITAL_COMMUNITY)
Admission: RE | Admit: 2016-04-18 | Discharge: 2016-04-18 | Disposition: A | Discharge status: Home / Self Care | Payer: BLUE CROSS/BLUE SHIELD | Source: Ambulatory Visit | Attending: Physician Assistant | Admitting: Physician Assistant

## 2016-04-18 ENCOUNTER — Encounter: Payer: Self-pay | Admitting: Physician Assistant

## 2016-04-18 ENCOUNTER — Ambulatory Visit (INDEPENDENT_AMBULATORY_CARE_PROVIDER_SITE_OTHER): Payer: BLUE CROSS/BLUE SHIELD | Admitting: Physician Assistant

## 2016-04-18 VITALS — BP 122/84 | HR 69 | Temp 97.5°F | Resp 14 | Ht 67.0 in | Wt 156.6 lb

## 2016-04-18 DIAGNOSIS — F419 Anxiety disorder, unspecified: Secondary | ICD-10-CM

## 2016-04-18 DIAGNOSIS — Z79899 Other long term (current) drug therapy: Secondary | ICD-10-CM

## 2016-04-18 DIAGNOSIS — Z1389 Encounter for screening for other disorder: Secondary | ICD-10-CM

## 2016-04-18 DIAGNOSIS — E785 Hyperlipidemia, unspecified: Secondary | ICD-10-CM | POA: Insufficient documentation

## 2016-04-18 DIAGNOSIS — Z124 Encounter for screening for malignant neoplasm of cervix: Secondary | ICD-10-CM | POA: Diagnosis not present

## 2016-04-18 DIAGNOSIS — Z0000 Encounter for general adult medical examination without abnormal findings: Secondary | ICD-10-CM | POA: Insufficient documentation

## 2016-04-18 DIAGNOSIS — Z136 Encounter for screening for cardiovascular disorders: Secondary | ICD-10-CM | POA: Diagnosis not present

## 2016-04-18 DIAGNOSIS — I10 Essential (primary) hypertension: Secondary | ICD-10-CM | POA: Diagnosis not present

## 2016-04-18 DIAGNOSIS — Z803 Family history of malignant neoplasm of breast: Secondary | ICD-10-CM

## 2016-04-18 DIAGNOSIS — R922 Inconclusive mammogram: Secondary | ICD-10-CM

## 2016-04-18 DIAGNOSIS — E559 Vitamin D deficiency, unspecified: Secondary | ICD-10-CM | POA: Insufficient documentation

## 2016-04-18 DIAGNOSIS — F988 Other specified behavioral and emotional disorders with onset usually occurring in childhood and adolescence: Secondary | ICD-10-CM

## 2016-04-18 LAB — BASIC METABOLIC PANEL WITH GFR
BUN: 16 mg/dL (ref 7–25)
CALCIUM: 9.2 mg/dL (ref 8.6–10.2)
CO2: 29 mmol/L (ref 20–31)
Chloride: 104 mmol/L (ref 98–110)
Creat: 1.25 mg/dL — ABNORMAL HIGH (ref 0.50–1.10)
GFR, Est African American: 64 mL/min (ref >=60)
GFR, Est Non African American: 56 mL/min — ABNORMAL LOW (ref >=60)
Glucose, Bld: 82 mg/dL (ref 65–99)
Potassium: 4.1 mmol/L (ref 3.5–5.3)
SODIUM: 140 mmol/L (ref 135–146)

## 2016-04-18 LAB — LIPID PANEL
CHOL/HDL RATIO: 2.5 ratio (ref <5.0)
CHOLESTEROL: 221 mg/dL — AB (ref <200)
HDL: 87 mg/dL (ref >50)
LDL Cholesterol: 113 mg/dL — ABNORMAL HIGH (ref <100)
Triglycerides: 104 mg/dL (ref <150)
VLDL: 21 mg/dL (ref <30)

## 2016-04-18 LAB — CBC WITH DIFFERENTIAL/PLATELET
BASOS ABS: 0 {cells}/uL (ref 0–200)
Basophils Relative: 0 %
Eosinophils Absolute: 116 cells/uL (ref 15–500)
Eosinophils Relative: 2 %
HEMATOCRIT: 38.6 % (ref 35.0–45.0)
HEMOGLOBIN: 12.9 g/dL (ref 11.7–15.5)
LYMPHS PCT: 33 %
Lymphs Abs: 1914 cells/uL (ref 850–3900)
MCH: 30.9 pg (ref 27.0–33.0)
MCHC: 33.4 g/dL (ref 32.0–36.0)
MCV: 92.3 fL (ref 80.0–100.0)
MONO ABS: 522 {cells}/uL (ref 200–950)
MPV: 10.2 fL (ref 7.5–12.5)
Monocytes Relative: 9 %
NEUTROS PCT: 56 %
Neutro Abs: 3248 cells/uL (ref 1500–7800)
PLATELETS: 259 10*3/uL (ref 140–400)
RBC: 4.18 MIL/uL (ref 3.80–5.10)
RDW: 13.4 % (ref 11.0–15.0)
WBC: 5.8 10*3/uL (ref 3.8–10.8)

## 2016-04-18 LAB — HEPATIC FUNCTION PANEL
ALT: 10 U/L (ref 6–29)
AST: 19 U/L (ref 10–30)
Albumin: 4.5 g/dL (ref 3.6–5.1)
Alkaline Phosphatase: 38 U/L (ref 33–115)
BILIRUBIN DIRECT: 0.1 mg/dL (ref <=0.2)
BILIRUBIN TOTAL: 0.6 mg/dL (ref 0.2–1.2)
Indirect Bilirubin: 0.5 mg/dL (ref 0.2–1.2)
Total Protein: 6.7 g/dL (ref 6.1–8.1)

## 2016-04-18 NOTE — Patient Instructions (Signed)
[facility]  7 a.m.-6:30 p.m., Monday 7 a.m.-5 p.m., Tuesday-Friday Schedule an appointment by calling [phone].  [facility] Schedule an appointment by calling [phone].  Encourage you to get the 3D Mammogram  The 3D Mammogram is much more specific and sensitive to pick up breast cancer. For women with fibrocystic breast or lumpy breast it can be hard to determine if it is cancer or not but the 3D mammogram is able to tell this difference which cuts back on unneeded additional tests or scary call backs.   - over 40% increase in detection of breast cancer - over 40% reduction in false positives.  - fewer call backs - reduced anxiety - improved outcomes - [facility]  

## 2016-04-18 NOTE — Progress Notes (Signed)
Complete Physical  Assessment and Plan:  Routine general medical examination at a health care facility  Attention deficit disorder, unspecified hyperactivity presence Off meds at this time  Anxiety -     TSH  Hyperlipidemia, unspecified hyperlipidemia type -     TSH -     Lipid panel  Vitamin D deficiency -     VITAMIN D 25 Hydroxy (Vit-D Deficiency, Fractures)  Screening for blood or protein in urine -     Urinalysis, Routine w reflex microscopic -     Microalbumin / creatinine urine ratio  Dense breast tissue Will monitor  Medication management -     CBC with Differential/Platelet -     BASIC METABOLIC PANEL WITH GFR -     Hepatic function panel -     Magnesium  Cervical cancer screening -     Cytology - PAP  Screening for cardiovascular condition -     EKG 12-Lead  Family history of breast cancer Mom with breast cancer at 70, will start screening, get screening mammogram   Discussed med's effects and SE's. Screening labs and tests as requested with regular follow-up as recommended.  HPI  36 y.o. female  presents for a complete physical.   Her blood pressure has been controlled at home, today their BP is BP: 122/84 She does workout. She denies chest pain, shortness of breath, dizziness.  She has had abnormal kidney function in the past but has had normal labs, including ANA, antiDNA, complements, lyme, ferritin, iron, sed rate.  Lab Results  Component Value Date   CREATININE 1.17 (H) 02/19/2015   BUN 11 02/19/2015   NA 138 02/19/2015   K 4.4 02/19/2015   CL 105 02/19/2015   CO2 25 02/19/2015   She is not on cholesterol medication and denies myalgias. Her cholesterol is at goal. The cholesterol last visit was:   Lab Results  Component Value Date   CHOL 213 (H) 02/19/2015   HDL 86 02/19/2015   LDLCALC 117 02/19/2015   TRIG 50 02/19/2015   CHOLHDL 2.5 02/19/2015   Follows with Dr. Jorja Loa for abnormal nevus.  [name]    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  She is married,  has a 63 year old daughter [name] and a 49 year old son.   Current Medications:  No current outpatient prescriptions on file prior to visit.   No current facility-administered medications on file prior to visit.    Health Maintenance:   Immunization History  Administered Date(s) Administered  . Tdap 12/03/2011   Tetanus: 2013 Pneumovax: N/A Prevnar 71 due age 89 Flu vaccine:2015 did not get this year Zostavax: due age 92-60.   Patient's last menstrual period was 04/09/2016. never got [place] Pap: 2015 normal DUE, Has had abnormal pap once 3-4 years MGM: 2012, mom with stage 3 cancer at age 37, and had ovarian cancer, mom with 2 sister and GM without cancer.   DEXA: N/A Colonoscopy: N/A EGD: N/A Last Dental Exam: Dr. Catha Brow Last Eye Exam: Dr. Barbara Cower- Jodelle Red, sees PA Tresa Endo   Patient Care Team: [name], MD as PCP - General (Internal Medicine) Kathrin Penner. Mitzi Hansen, MD as Referring Physician (Orthopedic Surgery)  Medical  History:  Past Medical History:  Diagnosis Date  . ADD (attention deficit disorder)   . Anxiety   . History of dysplastic nevus   . 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 Preterm labor    Allergies No Known Allergies  SURGICAL HISTORY She  has a past surgical history that includes Cesarean section. FAMILY HISTORY Her family history includes Alcohol abuse in her father; Cancer (age of onset: 21) in her mother; Diabetes in her father; Heart disease in her father; Hyperlipidemia in her father; Hypertension in her father. SOCIAL HISTORY She  reports that she has never smoked. She has never used smokeless tobacco. She reports that she drinks alcohol. She reports  that she does not use drugs.  Review of Systems: Review of Systems  Constitutional: Negative.   HENT: Negative.   Eyes: Negative.   Respiratory: Negative.   Cardiovascular: Negative.   Gastrointestinal: Negative for abdominal pain.  Genitourinary: Negative.   Musculoskeletal: Negative.   Skin: Negative.   Neurological: Negative.   Psychiatric/Behavioral: Negative.     Physical Exam: Estimated body mass index is 24.53 kg/m as calculated from the following:   Height as of this encounter:  (1.702 m).   Weight as of this encounter: 156 lb 9.6 oz (71 kg). BP 122/84   Pulse 69   Temp 97.5 F (36.4 C)   Resp 14   Ht  (1.702 m)   Wt 156 lb 9.6 oz (71 kg)   LMP 04/09/2016   SpO2 98%   BMI 24.53 kg/m  General Appearance: Well nourished, in no apparent distress.  Eyes: PERRLA, EOMs, conjunctiva no swelling or erythema, normal fundi and vessels.  Sinuses: No Frontal/maxillary tenderness  ENT/Mouth: Ext aud canals clear, normal light reflex with TMs without erythema, bulging. Good dentition. no swelling, or exudate on post pharynx. Tonsils not swollen or erythematous. Hearing normal.  Neck: Supple, thyroid nodules appreciated. No bruits  Respiratory: Respiratory effort normal, BS equal bilaterally without rales, rhonchi, wheezing or stridor.  Cardio: RRR without murmurs, rubs or gallops. Brisk peripheral pulses without edema.  Chest: symmetric, with normal excursions and percussion.  Breasts: Symmetric, + mobile small lumps, 0.5cm right upper quadrant, without nipple discharge, retractions.  Abdomen: Soft, , flat, no guarding, rebound, hernias, masses, or organomegaly. .  Lymphatics: Non tender without lymphadenopathy.  Genitourinary: .normal external genitalia, vulva, vagina, cervix, uterus and adnexa. Musculoskeletal: Full ROM all peripheral extremities,5/5 strength, and normal gait.  Skin: Warm, dry without rashes, lesions, ecchymosis. Neuro: Cranial nerves intact,  reflexes equal bilaterally. Normal muscle tone, no cerebellar symptoms. Psych: Awake and oriented X 3, normal affect, Insight and Judgment appropriate.   EKG:  WNL   [name] 2:33 PM [facility]

## 2016-04-19 LAB — URINALYSIS, ROUTINE W REFLEX MICROSCOPIC
BILIRUBIN URINE: NEGATIVE
GLUCOSE, UA: NEGATIVE
HGB URINE DIPSTICK: NEGATIVE
KETONES UR: NEGATIVE
Leukocytes, UA: NEGATIVE
Nitrite: NEGATIVE
PROTEIN: NEGATIVE
Specific Gravity, Urine: 1.026 (ref 1.001–1.035)
pH: 6 (ref 5.0–8.0)

## 2016-04-19 LAB — MICROALBUMIN / CREATININE URINE RATIO
Creatinine, Urine: 242 mg/dL (ref 20–320)
Microalb Creat Ratio: 2 mcg/mg creat (ref <30)
Microalb, Ur: 0.4 mg/dL

## 2016-04-19 LAB — TSH: TSH: 1.52 m[IU]/L

## 2016-04-19 LAB — VITAMIN D 25 HYDROXY (VIT D DEFICIENCY, FRACTURES): VIT D 25 HYDROXY: 35 ng/mL (ref 30–100)

## 2016-04-19 LAB — MAGNESIUM: MAGNESIUM: 2 mg/dL (ref 1.5–2.5)

## 2016-04-21 LAB — CYTOLOGY - PAP
Diagnosis: NEGATIVE
HPV: NOT DETECTED

## 2016-05-16 ENCOUNTER — Ambulatory Visit (INDEPENDENT_AMBULATORY_CARE_PROVIDER_SITE_OTHER): Payer: BLUE CROSS/BLUE SHIELD | Admitting: Physician Assistant

## 2016-05-16 ENCOUNTER — Encounter: Payer: Self-pay | Admitting: Physician Assistant

## 2016-05-16 VITALS — BP 100/74 | HR 82 | Temp 97.5°F | Resp 14 | Ht 67.0 in | Wt 151.8 lb

## 2016-05-16 DIAGNOSIS — M898X1 Other specified disorders of bone, shoulder: Secondary | ICD-10-CM | POA: Diagnosis not present

## 2016-05-16 DIAGNOSIS — M25511 Pain in right shoulder: Secondary | ICD-10-CM | POA: Diagnosis not present

## 2016-05-16 NOTE — Progress Notes (Signed)
   Subjective:    Patient ID: Jasmine PickerelLauren Eskenazi, female    DOB: February 23, 1980, 36 y.o.   MRN: [id number]  HPI 36 y.o. WF presents with right shoulder pain, very active, has been playing volleyball, tennis, and golf, states has had right shoulder pain made worse by golf. Pain is at clavicle, feels that it is protruding out, pain with getting off shirts/lifting arms, hurt shoulder with volley ball and doing shoulder lifts at a decline with right shoulder had pain. No weakness, no radiation down her arms, no numbness/tingling.   Blood pressure 100/74, pulse 82, temperature 97.5 F (36.4 C), resp. rate 14, height 5\' 7"  (1.702 m), weight 151 lb 12.8 oz (68.9 kg), last menstrual period 05/09/2016, SpO2 99 %.  Medications No current outpatient prescriptions on file prior to visit.   No current facility-administered medications on file prior to visit.     Problem list She has Anxiety; Abnormal menses; ADD (attention deficit disorder); Hyperlipidemia; Vitamin D deficiency; and Medication management on her problem list.   Review of Systems  Constitutional: Negative.  Negative for chills, fatigue and fever.  HENT: Negative.  Negative for congestion, sore throat and trouble swallowing.   Respiratory: Negative.   Cardiovascular: Negative.   Genitourinary: Negative.   Musculoskeletal: Positive for arthralgias (right shoulder/clavicle). Negative for back pain, gait problem, joint swelling, myalgias, neck pain and neck stiffness.  Skin: Negative.  Negative for rash.  Neurological: Negative.  Negative for dizziness, tremors, seizures, syncope, facial asymmetry, speech difficulty, weakness, light-headedness, numbness and headaches.  Psychiatric/Behavioral: Negative for agitation and confusion.       Objective:   Physical Exam  Constitutional: She is oriented to person, place, and time. She appears well-developed and well-nourished.  HENT:  Head: Normocephalic and atraumatic.  Right Ear: External ear  normal.  Left Ear: External ear normal.  Mouth/Throat: Oropharynx is clear and moist.  Eyes: Conjunctivae and EOM are normal. Pupils are equal, round, and reactive to light.  Neck: Normal range of motion. Neck supple. No thyromegaly present.  Cardiovascular: Normal rate, regular rhythm and normal heart sounds.  Exam reveals no gallop and no friction rub.   No murmur heard. Pulmonary/Chest: Effort normal and breath sounds normal. No respiratory distress. She has no wheezes.  Abdominal: Soft. Bowel sounds are normal. She exhibits no distension and no mass. There is no tenderness. There is no rebound and no guarding.  Musculoskeletal: Normal range of motion.  Right distal neuromuscular exam negative, positive for tenderness over the acromioclavicular joint, proximal clavicular attachment protruding slightly but no obvious deformity, sensory exam normal and radial pulse intact  Strength is normal and symmetric in arms, pain with [facility]   Lymphadenopathy:    She has no cervical adenopathy.  Neurological: She is alert and oriented to person, place, and time. She displays normal reflexes. No cranial nerve deficit. Coordination normal.  Skin: Skin is warm and dry. No rash noted. No erythema.  Psychiatric: She has a normal mood and affect.      Assessment & Plan:  Pain of right clavicle and shoulder pain Get Xray? separation, rest, ice, tylenol - DG Clavicle Right; Future - Ambulatory referral to Orthopedics

## 2016-05-16 NOTE — Patient Instructions (Addendum)
You can take tylenol (500mg ) or tylenol arthritis (650mg ) with the meloxicam/antiinflammatories. The max you can take of tylenol a day is 3000mg  daily, this is a max of 6 pills a day of the regular tyelnol (500mg ) or a max of 4 a day of the tylenol arthritis (650mg ) as long as no other medications you are taking contain tylenol.    Acromioclavicular Separation Acromioclavicular separation is an injury to the small joint at the top of the shoulder (acromioclavicular joint or AC joint). [facility] connects the outer tip of the collarbone (clavicle) to the top of the shoulder blade (acromion). Two strong cords of tissue (acromioclavicular ligament and coracoclavicular ligament) stretch across the [facility] to keep it in place. An AC joint separation happens when one or both ligaments stretch or tear, causing the joint to separate. There are six types of separation. The type of separation that you have depends on how much the ligaments are damaged and how far the joint has moved out of place. The less severe types of separation are most common. What are the causes? Common causes of this condition include:  A hard, direct hit (blow) to the top of the shoulder.  Falling on the shoulder.  Falling on an outstretched arm. What increases the risk? This condition is more likely to develop in female athletes under age 36 who participate in sports that involve potential contact, such as:  Football.  Rugby.  Hockey.  Cycling.  Martial arts. What are the signs or symptoms?   The main symptom of this condition is shoulder pain. Pain may be mild or severe, depending on the type of separation. Other signs and symptoms in the shoulder may include:  Swelling.  Limited range of motion, especially when moving the arm across the body.  Pain and tenderness when touching the top of the shoulder.  Pain when putting weight on the shoulder, such as rolling on the shoulder while sleeping.  A visible bump  (deformity) over the joint.  A clicking or popping sound when moving the shoulder. How is this diagnosed? This condition may be diagnosed based on:  Your symptoms.  Your medical history, including your history of recent injuries.  A physical exam to check for deformity and limited range of motion.  Imaging tests, such as:  X-rays.  MRI.  Ultrasound. How is this treated? Treatment for this condition may include:  Resting the shoulder before gradually returning to normal activities.  Icing the shoulder.  NSAIDs to help reduce pain and swelling.  A sling to support your shoulder and keep it from moving.  Physical therapy.  Surgery. This is rare. Surgery may be needed for severe injuries that include breaks (fractures) in a bone, or injuries that do not get better with nonsurgical treatments. Surgery is followed by keeping your joint in place for a period of time (immobilization) and physical therapy. Follow these instructions at home: If you have a sling:   Wear the sling as told by your health care provider. Remove it only as told by your health care provider.  Reposition the sling if your fingers tingle, become numb, or turn cold and blue.  Do not let your sling get wet if it is not waterproof. Ask your health care provider if you can remove the sling for bathing and showering.  Keep the sling clean. Managing pain, stiffness, and swelling    If directed, apply ice to the injured area.  Put ice in a plastic bag.  Place  a towel between your skin and the bag.  Leave the ice on for 20 minutes, 2-3 times a day.  Move your fingers often to avoid stiffness and to lessen swelling. Driving   Do not drive or operate heavy machinery while taking prescription pain medicine.  Ask your health care provider when it is safe for you to drive. Activity   Rest and return to your normal activities as told by your health care provider. Ask your health care provider what  activities are safe for you.  Do exercises as told by your health care provider. General instructions   Do not use any tobacco products, such as cigarettes, chewing tobacco, and e-cigarettes. Tobacco can delay healing. If you need help quitting, ask your health care provider.  Take over-the-counter and prescription medicines only as told by your health care provider.  Keep all follow-up visits as told by your health care provider. This is important. How is this prevented?  Make sure to use equipment that fits you.  Wear shoulder padding during contact sports.  Be safe and responsible while being active to avoid falls. Contact a health care provider if:  Your pain and stiffness do not improve after 2 weeks. This information is not intended to replace advice given to you by your health care provider. Make sure you discuss any questions you have with your health care provider. Document Released: 12/26/2004 Document Revised: 09/02/2015 Document Reviewed: 11/28/2014 Elsevier Interactive Patient Education  2017 [employer]   Acromioclavicular Separation Rehab Ask your health care provider which exercises are safe for you. Do exercises exactly as told by your health care provider and adjust them as directed. It is normal to feel mild stretching, pulling, tightness, or discomfort as you do these exercises, but you should stop right away if you feel sudden pain or your pain gets worse.Do not begin these exercises until told by your health care provider. Stretching and range of motion exercises These exercises warm up your muscles and joints and improve the movement and flexibility of your shoulder. These exercises also help to relieve pain and stiffness. Exercise A: Pendulum   1. Stand near a wall or a surface that you can hold onto for balance. 2. Bend forward at the waist and let your left / right arm hang straight down. Use your other arm to keep your balance. 3. Relax your arm and  shoulder muscles, and move your hips and your trunk so your left / right arm swings freely. Your arm should swing because of the motion of your body, not because you are using your arm or shoulder muscles. 4. Keep moving so your arm swings in the following directions, as told by your health care provider:  Side to side.  Forward and backward.  In clockwise and counterclockwise circles. 5. Slowly return to the starting position. Repeat __________ times, or for __________ seconds per direction. Complete this exercise __________ times a day. Exercise B: Flexion, seated   1. Sit in a stable chair and rest your left / right forearm on a flat surface. Your elbow should rest at a height that keeps your upper arm next to your body. 2. Keeping your left / right shoulder relaxed, lean forward at the waist and slide your hand forward until you feel a stretch in your shoulder. You can move your chair farther away from the surface to increase the stretch, if needed. 3. Hold for __________ seconds. 4. Slowly return to the starting position. Repeat __________ times. Complete  this exercise __________ times a day. Strengthening exercises These exercises build strength and endurance in your shoulder. Endurance is the ability to use your muscles for a long time, even after they get tired. Exercise C: Scapular retraction   1. Sit in a stable chair without armrests, or stand. 2. Secure an exercise band to a stable object in front of you so the band is at shoulder height. 3. Hold one end of the exercise band in each hand. 4. Squeeze your shoulder blades together and move your elbows slightly behind you. Do not shrug your shoulders while you do this. 5. Hold for __________ seconds. 6. Slowly return to the starting position. Repeat __________ times. Complete this exercise __________ times a day. Exercise D: Shoulder abduction  1. Sit in a stable chair without arm rests, or stand. 2. If directed, hold a  __________ weight in your left / right hand. 3. Start with your arms straight down. Turn your left / right hand so your palm faces in, toward your body. 4. Slowly lift your right / left hand out to your side. Do not lift your hand above shoulder height.  Keep your arms straight.  Avoid shrugging your shoulder while you do this movement. Keep your shoulder blade tucked down toward the middle of your back. 5. Hold for __________ seconds. 6. Slowly lower your arm and return to the starting position. Repeat __________ times. Complete this exercise __________ times a day. Exercise E: Scapular protraction, standing   1. Stand so you are facing a wall. Place your feet about one arm-length away from the wall. 2. Place your hands on the wall and straighten your elbows. 3. Keep your hands on the wall as you push your upper back away from the wall. You should feel your shoulder blades sliding forward around your rib cage. Keep your elbows and your head still. 4. Hold for __________ seconds. 5. Slowly return to the starting position. Let your muscles relax completely before you repeat this exercise. Repeat __________ times. Complete this exercise __________ times a day. This information is not intended to replace advice given to you by your health care provider. Make sure you discuss any questions you have with your health care provider. Document Released: 12/26/2004 Document Revised: 09/02/2015 Document Reviewed: 11/28/2014 Elsevier Interactive Patient Education  2017 [employer].

## 2016-05-17 ENCOUNTER — Other Ambulatory Visit: Payer: Self-pay | Admitting: Physician Assistant

## 2016-05-17 ENCOUNTER — Ambulatory Visit (HOSPITAL_COMMUNITY)
Admission: RE | Admit: 2016-05-17 | Discharge: 2016-05-17 | Disposition: A | Discharge status: Home / Self Care | Payer: BLUE CROSS/BLUE SHIELD | Source: Ambulatory Visit | Attending: Physician Assistant | Admitting: Physician Assistant

## 2016-05-17 DIAGNOSIS — M898X1 Other specified disorders of bone, shoulder: Secondary | ICD-10-CM | POA: Diagnosis not present

## 2016-05-17 DIAGNOSIS — M25511 Pain in right shoulder: Secondary | ICD-10-CM | POA: Diagnosis not present

## 2016-05-17 NOTE — Addendum Note (Signed)
Addended by: Quentin MullingOLLIER, Jori Thrall R on: 05/17/2016 09:11 AM   Modules accepted: Orders

## 2016-06-08 ENCOUNTER — Ambulatory Visit (INDEPENDENT_AMBULATORY_CARE_PROVIDER_SITE_OTHER): Payer: BLUE CROSS/BLUE SHIELD | Admitting: Orthopedic Surgery

## 2016-06-08 DIAGNOSIS — G8929 Other chronic pain: Secondary | ICD-10-CM | POA: Diagnosis not present

## 2016-06-08 DIAGNOSIS — M25511 Pain in right shoulder: Secondary | ICD-10-CM | POA: Diagnosis not present

## 2016-06-08 MED ORDER — DICLOFENAC SODIUM 2 % TD SOLN: TRANSDERMAL | 0 refills | Status: DC

## 2016-06-08 NOTE — Progress Notes (Signed)
Office Visit Note   Patient: Jasmine Combs           Date of Birth: [birth year]           MRN: [id number] Visit Date: 06/08/2016 Requested by: Quentin Mullingollier, Amanda, PA-C [address] [place], [place] [address] PCP: Lucky CowboyMcKeown, William, MD  Subjective: Chief Complaint  Patient presents with  . Right Shoulder - Pain    HPI: Jasmine Combs is an active 36 year old patient with right sternoclavicular and clavicle pain.  Radiographs are done which are reviewed which were essentially normal in terms of the clavicle.  She is concerned about the prominence of the sternoclavicular joint on the right.  She plays tennis and works out.  She works out 7 days a week.  It hurts her when she is doing certain movements.  Her to raise her arm up at times.  She sees Dr. Cyndia BentGlisson patches for her shoulder which has helped a lot.  She also done some dry needling in therapy which has helped some.  At times is painful for her to take her shirt off.  She denies any neck pain or radicular symptoms in the right arm              ROS: All systems reviewed are negative as they relate to the chief complaint within the history of present illness.  Patient denies  fevers or chills.   Assessment & Plan: Visit Diagnoses: No diagnosis found.  Plan: Impression is right shoulder pain sternoclavicular pain with some prominence of the sternoclavicular joint without any significant trauma which would cause dislocation or fracture.  Based on the minutes he works out this is likely overuse related pain in the sternoclavicular joint and possibly in the acromioclavicular joint.  Shoulder exam is otherwise normal.  Plan is for her to try some topical anti-inflammatory to see if that can help with that sternoclavicular joint.  That's something that should be helpful in the short-term.  This is probably overuse related and even though she has made some adjustments to her workout routine further may be needed to diminish some of the pain  along the clavicle.  We discussed getting a CT scan to confirm the degenerative nature of this sternoclavicular joint problem but since it won't really change any intervention will hold off on that for now.  Injection is feasible for that but we will also hold off on that intervention for now.  Follow-Up Instructions: No Follow-up on file.   Orders:  No orders of the defined types were placed in this encounter.  No orders of the defined types were placed in this encounter.     Procedures: No procedures performed   Clinical Data: No additional findings.  Objective: Vital Signs: LMP 05/09/2016   Physical Exam:   Constitutional: Patient appears well-developed HEENT:  Head: Normocephalic Eyes:EOM are normal Neck: Normal range of motion Cardiovascular: Normal rate Pulmonary/chest: Effort normal Neurologic: Patient is alert Skin: Skin is warm Psychiatric: Patient has normal mood and affect    Ortho Exam: Orthopedic exam demonstrates very fit appearing individual.  She has good cervical spine range of motion.  She has full active and passive range of motion of both shoulders.  She does have slight prominence of the sternoclavicular joint on the right compared to the left but the joint itself is located.  Preoperatively tender or inflamed just more prominent.  She has excellent rotator cuff strength on the right with negative apprehension relocation testing and negative O'Brien's testing.  No real asymmetric acromioclavicular joint tenderness right versus left.  No course grinding or crepitus with internal/external rotation of that right shoulder.  Specialty Comments:  No specialty comments available.  Imaging: No results found.   PMFS History: Patient Active Problem List   Diagnosis Date Noted  . Hyperlipidemia 04/18/2016  . Vitamin D deficiency 04/18/2016  . Medication management 04/18/2016  . ADD (attention deficit disorder) 07/24/2014  . Abnormal menses 12/25/2013  .  Anxiety    Past Medical History:  Diagnosis Date  . ADD (attention deficit disorder)   . Anxiety   . History of dysplastic nevus   . 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 Preterm labor     Family History  Problem Relation Age of Onset  . Cancer Mother 79       breast  . Hypertension Father   . Heart disease Father   . Diabetes Father   . Alcohol abuse Father   . Hyperlipidemia Father     Past Surgical History:  Procedure Laterality Date  . CESAREAN SECTION     Social History   Occupational History  . Not on file.   Social History Main Topics  . Smoking status: Never Smoker  . Smokeless tobacco: Never Used  . Alcohol use Yes     Comment: rare  . Drug use: No  . Sexual activity: Yes    Birth control/ protection: None

## 2016-06-09 ENCOUNTER — Encounter (INDEPENDENT_AMBULATORY_CARE_PROVIDER_SITE_OTHER): Payer: Self-pay | Admitting: Orthopedic Surgery

## 2016-11-13 DIAGNOSIS — M25511 Pain in right shoulder: Secondary | ICD-10-CM | POA: Diagnosis not present

## 2016-11-15 DIAGNOSIS — M25511 Pain in right shoulder: Secondary | ICD-10-CM | POA: Diagnosis not present

## 2016-11-24 DIAGNOSIS — M25511 Pain in right shoulder: Secondary | ICD-10-CM | POA: Diagnosis not present

## 2016-11-28 DIAGNOSIS — M25511 Pain in right shoulder: Secondary | ICD-10-CM | POA: Diagnosis not present

## 2016-12-05 DIAGNOSIS — M25511 Pain in right shoulder: Secondary | ICD-10-CM | POA: Diagnosis not present

## 2016-12-12 ENCOUNTER — Other Ambulatory Visit: Payer: Self-pay | Admitting: Physician Assistant

## 2016-12-12 DIAGNOSIS — M25511 Pain in right shoulder: Secondary | ICD-10-CM

## 2016-12-14 DIAGNOSIS — M25511 Pain in right shoulder: Secondary | ICD-10-CM | POA: Diagnosis not present

## 2016-12-29 DIAGNOSIS — M25511 Pain in right shoulder: Secondary | ICD-10-CM | POA: Diagnosis not present

## 2017-02-26 ENCOUNTER — Encounter: Payer: Self-pay | Admitting: Physician Assistant

## 2017-04-11 ENCOUNTER — Encounter: Payer: Self-pay | Admitting: Physician Assistant

## 2017-04-18 ENCOUNTER — Encounter: Payer: Self-pay | Admitting: Physician Assistant

## 2017-05-06 NOTE — Progress Notes (Signed)
Complete Physical  Assessment and Plan:  Routine general medical examination at a health care facility 1 year  Attention deficit disorder, unspecified hyperactivity presence Off meds at this time  Anxiety -     TSH  Hyperlipidemia, unspecified hyperlipidemia type -     TSH -     Lipid panel  Vitamin D deficiency -     VITAMIN D 25 Hydroxy (Vit-D Deficiency, Fractures)  Screening for blood or protein in urine -     Urinalysis, Routine w reflex microscopic -     Microalbumin / creatinine urine ratio  Dense breast tissue Will monitor- will get MGM  Medication management -     CBC with Differential/Platelet -     BASIC METABOLIC PANEL WITH GFR -     Hepatic function panel -     Magnesium  Cervical cancer screening -    Due 5 years  Family history of breast cancer Mom with breast cancer at 71, will start screening, get screening mammogram  Suprapubic pain With family history of cancer will get AB [place]  Discussed med's effects and SE's. Screening labs and tests as requested with regular follow-up as recommended.  HPI  37 y.o. female  presents for a complete physical.   Her blood pressure has been controlled at home, today their BP is BP: 110/72 She does workout. She denies chest pain, shortness of breath, dizziness.  She has had abnormal kidney function in the past but has had normal labs, including ANA, antiDNA, complements, lyme, ferritin, iron, sed rate.  Lab Results  Component Value Date   CREATININE 1.25 (H) 04/18/2016   BUN 16 04/18/2016   NA 140 04/18/2016   K 4.1 04/18/2016   CL 104 04/18/2016   CO2 29 04/18/2016   She is not on cholesterol medication and denies myalgias. Her cholesterol is at goal. The cholesterol last visit was:   Lab Results  Component Value Date   CHOL 221 (H) 04/18/2016   HDL 87 04/18/2016   LDLCALC 113 (H) 04/18/2016   TRIG 104 04/18/2016   CHOLHDL 2.5 04/18/2016   Follows with Dr. Jorja Combs for abnormal nevus.  Patient is on  Vitamin D supplement.   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  She has had some position suprapubic pain x 1 month, worse with twisting, no AB bloating.  She is married,  has a 15 year old daughter Jasmine Combs and a 68 year old son.   Current Medications:  No current outpatient medications on file prior to visit.   No current facility-administered medications on file prior to visit.    Health Maintenance:   Immunization History  Administered Date(s) Administered  . Tdap 12/03/2011   Tetanus: 2013 Pneumovax: N/A Prevnar 54 due age 41 Flu vaccine:2018 Zostavax: due age 35-60.   Patient's last menstrual period was 04/21/2017. Pap: 04/2016 normal NEG HPV MGM: 2012, mom with stage 3 cancer at age 13, and had ovarian cancer, mom with 2 sister and GM without cancer. Mom tested negative.  DEXA: N/A Colonoscopy: N/A EGD: N/A Last Dental Exam: Dr. Catha Combs Last Eye Exam: Dr. Barbara Combs- Jodelle Red, sees PA Jasmine Combs   Patient Care Team: Jasmine Cowboy, MD as PCP - General (Internal Medicine) Jasmine Bores L.,  MD as Referring Physician (Orthopedic Surgery)  Medical History:  Past Medical History:  Diagnosis Date  . ADD (attention deficit disorder)   . Anxiety   . History of dysplastic nevus   . 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 Preterm labor    Allergies No Known Allergies  SURGICAL HISTORY She  has a past surgical history that includes Cesarean section. FAMILY HISTORY Her family history includes Alcohol abuse in her father; Cancer (age of onset: 52) in her mother; Diabetes in her father; Heart disease in her father; Hyperlipidemia in her father; Hypertension in her father. SOCIAL HISTORY She  reports that she has never smoked. She has never used  smokeless tobacco. She reports that she drinks alcohol. She reports that she does not use drugs.  Review of Systems: Review of Systems  Constitutional: Negative.   HENT: Negative.   Eyes: Negative.   Respiratory: Negative.   Cardiovascular: Negative.   Gastrointestinal: Positive for abdominal pain.  Genitourinary: Negative.   Musculoskeletal: Negative.   Skin: Negative.   Neurological: Negative.   Psychiatric/Behavioral: Negative.     Physical Exam: Estimated body mass index is 24.67 kg/m as calculated from the following:   Height as of this encounter: 5' 6.5" (1.689 m).   Weight as of this encounter: 155 lb 3.2 oz (70.4 kg). BP 110/72   Pulse 67   Temp 97.6 F (36.4 C)   Resp 16   Ht 5' 6.5" (1.689 m)   Wt 155 lb 3.2 oz (70.4 kg)   LMP 04/21/2017   SpO2 98%   BMI 24.67 kg/m  General Appearance: Well nourished, in no apparent distress.  Eyes: PERRLA, EOMs, conjunctiva no swelling or erythema, normal fundi and vessels.  Sinuses: No Frontal/maxillary tenderness  ENT/Mouth: Ext aud canals clear, normal light reflex with TMs without erythema, bulging. Good dentition. no swelling, or exudate on post pharynx. Tonsils not swollen or erythematous. Hearing normal.  Neck: Supple, thyroid nodules appreciated. No bruits  Respiratory: Respiratory effort normal, BS equal bilaterally without rales, rhonchi, wheezing or stridor.  Cardio: RRR without murmurs, rubs or gallops. Brisk peripheral pulses without edema.  Chest: symmetric, with normal excursions and percussion.  Breasts: Symmetric, + mobile small lumps, 0.5cm right upper quadrant, without nipple discharge, retractions.  Abdomen: Soft, , flat, no guarding, rebound, hernias, masses, or organomegaly. .  Lymphatics: Non tender without lymphadenopathy.  Genitourinary: .defer Musculoskeletal: Full ROM all peripheral extremities,5/5 strength, and normal gait.  Skin: Warm, dry without rashes, lesions, ecchymosis. Neuro: Cranial nerves  intact, reflexes equal bilaterally. Normal muscle tone, no cerebellar symptoms. Psych: Awake and oriented X 3, normal affect, Insight and Judgment appropriate.   EKG:  defer   Jasmine Combs 4:36 PM [facility]

## 2017-05-08 ENCOUNTER — Ambulatory Visit: Payer: BLUE CROSS/BLUE SHIELD | Admitting: Physician Assistant

## 2017-05-08 VITALS — BP 110/72 | HR 67 | Temp 97.6°F | Resp 16 | Ht 66.5 in | Wt 155.2 lb

## 2017-05-08 DIAGNOSIS — F419 Anxiety disorder, unspecified: Secondary | ICD-10-CM | POA: Diagnosis not present

## 2017-05-08 DIAGNOSIS — R6889 Other general symptoms and signs: Secondary | ICD-10-CM | POA: Diagnosis not present

## 2017-05-08 DIAGNOSIS — Z6824 Body mass index (BMI) 24.0-24.9, adult: Secondary | ICD-10-CM

## 2017-05-08 DIAGNOSIS — Z79899 Other long term (current) drug therapy: Secondary | ICD-10-CM

## 2017-05-08 DIAGNOSIS — Z803 Family history of malignant neoplasm of breast: Secondary | ICD-10-CM

## 2017-05-08 DIAGNOSIS — E559 Vitamin D deficiency, unspecified: Secondary | ICD-10-CM | POA: Diagnosis not present

## 2017-05-08 DIAGNOSIS — N926 Irregular menstruation, unspecified: Secondary | ICD-10-CM

## 2017-05-08 DIAGNOSIS — Z1389 Encounter for screening for other disorder: Secondary | ICD-10-CM | POA: Diagnosis not present

## 2017-05-08 DIAGNOSIS — R102 Pelvic and perineal pain: Secondary | ICD-10-CM

## 2017-05-08 DIAGNOSIS — E785 Hyperlipidemia, unspecified: Secondary | ICD-10-CM | POA: Diagnosis not present

## 2017-05-08 DIAGNOSIS — Z1329 Encounter for screening for other suspected endocrine disorder: Secondary | ICD-10-CM

## 2017-05-08 DIAGNOSIS — Z0001 Encounter for general adult medical examination with abnormal findings: Secondary | ICD-10-CM

## 2017-05-08 DIAGNOSIS — Z13220 Encounter for screening for lipoid disorders: Secondary | ICD-10-CM

## 2017-05-08 DIAGNOSIS — F988 Other specified behavioral and emotional disorders with onset usually occurring in childhood and adolescence: Secondary | ICD-10-CM

## 2017-05-08 DIAGNOSIS — R1024 Suprapubic pain: Secondary | ICD-10-CM

## 2017-05-08 LAB — CBC WITH DIFFERENTIAL/PLATELET
Basophils Absolute: 30 cells/uL (ref 0–200)
Basophils Relative: 0.4 %
EOS ABS: 83 {cells}/uL (ref 15–500)
Eosinophils Relative: 1.1 %
HCT: 38.4 % (ref 35.0–45.0)
Hemoglobin: 13 g/dL (ref 11.7–15.5)
Lymphs Abs: 1958 cells/uL (ref 850–3900)
MCH: 31.1 pg (ref 27.0–33.0)
MCHC: 33.9 g/dL (ref 32.0–36.0)
MCV: 91.9 fL (ref 80.0–100.0)
MPV: 10.7 fL (ref 7.5–12.5)
Monocytes Relative: 8.7 %
Neutro Abs: 4778 cells/uL (ref 1500–7800)
Neutrophils Relative %: 63.7 %
PLATELETS: 214 10*3/uL (ref 140–400)
RBC: 4.18 10*6/uL (ref 3.80–5.10)
RDW: 12.6 % (ref 11.0–15.0)
TOTAL LYMPHOCYTE: 26.1 %
WBC: 7.5 10*3/uL (ref 3.8–10.8)
WBCMIX: 653 {cells}/uL (ref 200–950)

## 2017-05-08 LAB — TSH: TSH: 2.16 mIU/L

## 2017-05-08 NOTE — Patient Instructions (Addendum)
I have put in an order for an ultrasound for you to have You can set them up at your convenience by calling this number [phone] You will likely have the ultrasound at [address] [facility] [address]  If you have any issues call our office and we will set this up for you.   Get you MGM

## 2017-05-09 LAB — LIPID PANEL
CHOL/HDL RATIO: 2.6 (calc) (ref <5.0)
CHOLESTEROL: 254 mg/dL — AB (ref <200)
HDL: 97 mg/dL (ref >50)
LDL CHOLESTEROL (CALC): 139 mg/dL — AB
Non-HDL Cholesterol (Calc): 157 mg/dL (calc) — ABNORMAL HIGH (ref <130)
TRIGLYCERIDES: 79 mg/dL (ref <150)

## 2017-05-09 LAB — MICROALBUMIN / CREATININE URINE RATIO
Creatinine, Urine: 63 mg/dL (ref 20–275)
Microalb Creat Ratio: 6 mcg/mg creat (ref <30)
Microalb, Ur: 0.4 mg/dL

## 2017-05-09 LAB — URINALYSIS, ROUTINE W REFLEX MICROSCOPIC
BILIRUBIN URINE: NEGATIVE
Glucose, UA: NEGATIVE
Hgb urine dipstick: NEGATIVE
Ketones, ur: NEGATIVE
Leukocytes, UA: NEGATIVE
Nitrite: NEGATIVE
PROTEIN: NEGATIVE
SPECIFIC GRAVITY, URINE: 1.012 (ref 1.001–1.03)
pH: 7.5 (ref 5.0–8.0)

## 2017-05-09 LAB — COMPLETE METABOLIC PANEL WITH GFR
AG Ratio: 1.8 (calc) (ref 1.0–2.5)
ALKALINE PHOSPHATASE (APISO): 44 U/L (ref 33–115)
ALT: 10 U/L (ref 6–29)
AST: 18 U/L (ref 10–30)
Albumin: 4.6 g/dL (ref 3.6–5.1)
BUN/Creatinine Ratio: 13 (calc) (ref 6–22)
BUN: 14 mg/dL (ref 7–25)
CO2: 25 mmol/L (ref 20–32)
CREATININE: 1.11 mg/dL — AB (ref 0.50–1.10)
Calcium: 9.6 mg/dL (ref 8.6–10.2)
Chloride: 105 mmol/L (ref 98–110)
GFR, Est African American: 73 mL/min/{1.73_m2} (ref >=60)
GFR, Est Non African American: 63 mL/min/{1.73_m2} (ref >=60)
GLUCOSE: 83 mg/dL (ref 65–99)
Globulin: 2.5 g/dL (calc) (ref 1.9–3.7)
Potassium: 4.4 mmol/L (ref 3.5–5.3)
Sodium: 138 mmol/L (ref 135–146)
TOTAL PROTEIN: 7.1 g/dL (ref 6.1–8.1)
Total Bilirubin: 0.6 mg/dL (ref 0.2–1.2)

## 2017-05-09 LAB — MAGNESIUM: MAGNESIUM: 2.1 mg/dL (ref 1.5–2.5)

## 2017-05-09 LAB — VITAMIN D 25 HYDROXY (VIT D DEFICIENCY, FRACTURES): Vit D, 25-Hydroxy: 35 ng/mL (ref 30–100)

## 2017-05-11 ENCOUNTER — Other Ambulatory Visit: Payer: Self-pay | Admitting: Physician Assistant

## 2017-05-11 ENCOUNTER — Ambulatory Visit
Admission: RE | Admit: 2017-05-11 | Discharge: 2017-05-11 | Disposition: A | Discharge status: Home / Self Care | Payer: Managed Care, Other (non HMO) | Source: Ambulatory Visit | Attending: Physician Assistant | Admitting: Physician Assistant

## 2017-05-11 DIAGNOSIS — N6310 Unspecified lump in the right breast, unspecified quadrant: Secondary | ICD-10-CM

## 2017-05-11 DIAGNOSIS — R102 Pelvic and perineal pain: Secondary | ICD-10-CM

## 2017-05-11 DIAGNOSIS — R1030 Lower abdominal pain, unspecified: Secondary | ICD-10-CM | POA: Diagnosis not present

## 2017-05-14 ENCOUNTER — Encounter (INDEPENDENT_AMBULATORY_CARE_PROVIDER_SITE_OTHER): Payer: Self-pay

## 2017-05-16 ENCOUNTER — Ambulatory Visit
Admission: RE | Admit: 2017-05-16 | Discharge: 2017-05-16 | Disposition: A | Discharge status: Home / Self Care | Payer: BLUE CROSS/BLUE SHIELD | Source: Ambulatory Visit | Attending: Physician Assistant | Admitting: Physician Assistant

## 2017-05-16 ENCOUNTER — Ambulatory Visit
Admission: RE | Admit: 2017-05-16 | Discharge: 2017-05-16 | Disposition: A | Discharge status: Home / Self Care | Payer: Managed Care, Other (non HMO) | Source: Ambulatory Visit | Attending: Physician Assistant | Admitting: Physician Assistant

## 2017-05-16 DIAGNOSIS — N6489 Other specified disorders of breast: Secondary | ICD-10-CM | POA: Diagnosis not present

## 2017-05-16 DIAGNOSIS — N6310 Unspecified lump in the right breast, unspecified quadrant: Secondary | ICD-10-CM

## 2017-05-16 DIAGNOSIS — R922 Inconclusive mammogram: Secondary | ICD-10-CM | POA: Diagnosis not present

## 2017-05-24 DIAGNOSIS — N926 Irregular menstruation, unspecified: Secondary | ICD-10-CM | POA: Diagnosis not present

## 2017-05-24 DIAGNOSIS — N840 Polyp of corpus uteri: Secondary | ICD-10-CM | POA: Diagnosis not present

## 2017-05-24 DIAGNOSIS — Z803 Family history of malignant neoplasm of breast: Secondary | ICD-10-CM | POA: Diagnosis not present

## 2017-05-31 ENCOUNTER — Encounter: Payer: Self-pay | Admitting: Physician Assistant

## 2017-06-08 DIAGNOSIS — N840 Polyp of corpus uteri: Secondary | ICD-10-CM | POA: Diagnosis not present

## 2017-06-08 DIAGNOSIS — N926 Irregular menstruation, unspecified: Secondary | ICD-10-CM | POA: Diagnosis not present

## 2017-07-30 DIAGNOSIS — S86911A Strain of unspecified muscle(s) and tendon(s) at lower leg level, right leg, initial encounter: Secondary | ICD-10-CM | POA: Diagnosis not present

## 2017-07-30 DIAGNOSIS — M25562 Pain in left knee: Secondary | ICD-10-CM | POA: Diagnosis not present

## 2017-07-30 DIAGNOSIS — M13869 Other specified arthritis, unspecified knee: Secondary | ICD-10-CM | POA: Diagnosis not present

## 2017-08-01 DIAGNOSIS — S83512D Sprain of anterior cruciate ligament of left knee, subsequent encounter: Secondary | ICD-10-CM | POA: Diagnosis not present

## 2017-08-02 DIAGNOSIS — M25562 Pain in left knee: Secondary | ICD-10-CM | POA: Diagnosis not present

## 2017-08-06 DIAGNOSIS — M25562 Pain in left knee: Secondary | ICD-10-CM | POA: Diagnosis not present

## 2017-08-07 DIAGNOSIS — X58XXXA Exposure to other specified factors, initial encounter: Secondary | ICD-10-CM | POA: Diagnosis not present

## 2017-08-07 DIAGNOSIS — S83512A Sprain of anterior cruciate ligament of left knee, initial encounter: Secondary | ICD-10-CM | POA: Diagnosis not present

## 2017-08-07 DIAGNOSIS — G8918 Other acute postprocedural pain: Secondary | ICD-10-CM | POA: Diagnosis not present

## 2017-08-07 DIAGNOSIS — Y999 Unspecified external cause status: Secondary | ICD-10-CM | POA: Diagnosis not present

## 2017-08-10 DIAGNOSIS — M25562 Pain in left knee: Secondary | ICD-10-CM | POA: Diagnosis not present

## 2017-08-20 DIAGNOSIS — M25562 Pain in left knee: Secondary | ICD-10-CM | POA: Diagnosis not present

## 2017-08-28 DIAGNOSIS — M25562 Pain in left knee: Secondary | ICD-10-CM | POA: Diagnosis not present

## 2017-09-05 DIAGNOSIS — M25562 Pain in left knee: Secondary | ICD-10-CM | POA: Diagnosis not present

## 2017-09-07 DIAGNOSIS — M25562 Pain in left knee: Secondary | ICD-10-CM | POA: Diagnosis not present

## 2017-09-11 DIAGNOSIS — M25562 Pain in left knee: Secondary | ICD-10-CM | POA: Diagnosis not present

## 2017-09-14 DIAGNOSIS — M25562 Pain in left knee: Secondary | ICD-10-CM | POA: Diagnosis not present

## 2017-09-17 DIAGNOSIS — M25562 Pain in left knee: Secondary | ICD-10-CM | POA: Diagnosis not present

## 2017-09-21 DIAGNOSIS — M25562 Pain in left knee: Secondary | ICD-10-CM | POA: Diagnosis not present

## 2017-09-25 DIAGNOSIS — S83512D Sprain of anterior cruciate ligament of left knee, subsequent encounter: Secondary | ICD-10-CM | POA: Diagnosis not present

## 2017-09-26 DIAGNOSIS — M25562 Pain in left knee: Secondary | ICD-10-CM | POA: Diagnosis not present

## 2017-10-05 DIAGNOSIS — M25562 Pain in left knee: Secondary | ICD-10-CM | POA: Diagnosis not present

## 2017-10-19 DIAGNOSIS — M25562 Pain in left knee: Secondary | ICD-10-CM | POA: Diagnosis not present

## 2017-10-23 DIAGNOSIS — M25562 Pain in left knee: Secondary | ICD-10-CM | POA: Diagnosis not present

## 2017-10-26 DIAGNOSIS — M25562 Pain in left knee: Secondary | ICD-10-CM | POA: Diagnosis not present

## 2017-11-12 DIAGNOSIS — M25562 Pain in left knee: Secondary | ICD-10-CM | POA: Diagnosis not present

## 2018-02-25 DIAGNOSIS — M25562 Pain in left knee: Secondary | ICD-10-CM | POA: Diagnosis not present

## 2018-03-08 ENCOUNTER — Other Ambulatory Visit: Payer: Self-pay

## 2018-03-08 DIAGNOSIS — D2372 Other benign neoplasm of skin of left lower limb, including hip: Secondary | ICD-10-CM | POA: Diagnosis not present

## 2018-03-08 DIAGNOSIS — D235 Other benign neoplasm of skin of trunk: Secondary | ICD-10-CM | POA: Diagnosis not present

## 2018-05-13 NOTE — Progress Notes (Deleted)
Complete Physical  Assessment and Plan:  Routine general medical examination at a health care facility 1 year  Attention deficit disorder, unspecified hyperactivity presence Off meds at this time  Anxiety -     TSH  Hyperlipidemia, unspecified hyperlipidemia type -     TSH -     Lipid panel  Vitamin D deficiency -     VITAMIN D 25 Hydroxy (Vit-D Deficiency, Fractures)  Screening for blood or protein in urine -     Urinalysis, Routine w reflex microscopic -     Microalbumin / creatinine urine ratio  Dense breast tissue Will monitor- will get MGM  Medication management -     CBC with Differential/Platelet -     BASIC METABOLIC PANEL WITH GFR -     Hepatic function panel -     Magnesium  Cervical cancer screening -    Due 5 years  Family history of breast cancer Mom with breast cancer at 91, will start screening, get screening mammogram  Suprapubic pain With family history of cancer will get AB [place]  Discussed med's effects and SE's. Screening labs and tests as requested with regular follow-up as recommended.  HPI  38 y.o. female  presents for a complete physical.   Her blood pressure has been controlled at home, today their BP is   She does workout. She denies chest pain, shortness of breath, dizziness.  She has had abnormal kidney function in the past but has had normal labs, including ANA, antiDNA, complements, lyme, ferritin, iron, sed rate.  Lab Results  Component Value Date   CREATININE 1.11 (H) 05/08/2017   BUN 14 05/08/2017   NA 138 05/08/2017   K 4.4 05/08/2017   CL 105 05/08/2017   CO2 25 05/08/2017   She is not on cholesterol medication and denies myalgias. Her cholesterol is at goal. The cholesterol last visit was:   Lab Results  Component Value Date   CHOL 254 (H) 05/08/2017   HDL 97 05/08/2017   LDLCALC 139 (H) 05/08/2017   TRIG 79 05/08/2017   CHOLHDL 2.6 05/08/2017   Follows with Dr. Jorja Loa for abnormal nevus.  Patient is on Vitamin D  supplement.   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  She has had some position suprapubic pain x 1 month, worse with twisting, no AB bloating.  She is married,  has a 82 year old daughter [name] and a 53 year old son.   Current Medications:  No current outpatient medications on file prior to visit.   No current facility-administered medications on file prior to visit.    Health Maintenance:   Immunization History  Administered Date(s) Administered  . Tdap 12/03/2011   Tetanus: 2013 Pneumovax: N/A Prevnar 17 due age 89 Flu vaccine:2018 Zostavax: due age 23-60.   No LMP recorded. Pap: 04/2016 normal NEG HPV MGM: 2012, mom with stage 3 cancer at age 88, and had ovarian cancer, mom with 2 sister and GM without cancer. Mom tested negative.  DEXA: N/A Colonoscopy: N/A EGD: N/A Last Dental Exam: Dr. Catha Brow Last Eye Exam: Dr. Barbara Cower- Jodelle Red, sees PA Tresa Endo   Patient Care Team: [name], MD as PCP - General (Internal Medicine) Remus Blake., MD as Referring  Physician (Orthopedic Surgery) Shea EvansMody, Vaishali, MD as Consulting Physician (Obstetrics and Gynecology)  Medical History:  Past Medical History:  Diagnosis Date  . ADD (attention deficit disorder)   . Anxiety   . History of dysplastic nevus   . 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 Preterm labor    Allergies No Known Allergies  SURGICAL HISTORY She  has a past surgical history that includes Cesarean section. FAMILY HISTORY Her family history includes Alcohol abuse in her father; Breast cancer (age of onset: 2936) in her mother; Cancer (age of onset: 8536) in her mother; Diabetes in her father; Heart disease in her father; Hyperlipidemia in her father; Hypertension in her  father. SOCIAL HISTORY She  reports that she has never smoked. She has never used smokeless tobacco. She reports current alcohol use. She reports that she does not use drugs.  Review of Systems: Review of Systems  Constitutional: Negative.   HENT: Negative.   Eyes: Negative.   Respiratory: Negative.   Cardiovascular: Negative.   Gastrointestinal: Positive for abdominal pain.  Genitourinary: Negative.   Musculoskeletal: Negative.   Skin: Negative.   Neurological: Negative.   Psychiatric/Behavioral: Negative.     Physical Exam: Estimated body mass index is 24.67 kg/m as calculated from the following:   Height as of 05/08/17: 5' 6.5" (1.689 m).   Weight as of 05/08/17: 155 lb 3.2 oz (70.4 kg). There were no vitals taken for this visit. General Appearance: Well nourished, in no apparent distress.  Eyes: PERRLA, EOMs, conjunctiva no swelling or erythema, normal fundi and vessels.  Sinuses: No Frontal/maxillary tenderness  ENT/Mouth: Ext aud canals clear, normal light reflex with TMs without erythema, bulging. Good dentition. no swelling, or exudate on post pharynx. Tonsils not swollen or erythematous. Hearing normal.  Neck: Supple, thyroid nodules appreciated. No bruits  Respiratory: Respiratory effort normal, BS equal bilaterally without rales, rhonchi, wheezing or stridor.  Cardio: RRR without murmurs, rubs or gallops. Brisk peripheral pulses without edema.  Chest: symmetric, with normal excursions and percussion.  Breasts: Symmetric, + mobile small lumps, 0.5cm right upper quadrant, without nipple discharge, retractions.  Abdomen: Soft, , flat, no guarding, rebound, hernias, masses, or organomegaly. .  Lymphatics: Non tender without lymphadenopathy.  Genitourinary: .defer Musculoskeletal: Full ROM all peripheral extremities,5/5 strength, and normal gait.  Skin: Warm, dry without rashes, lesions, ecchymosis. Neuro: Cranial nerves intact, reflexes equal bilaterally. Normal muscle  tone, no cerebellar symptoms. Psych: Awake and oriented X 3, normal affect, Insight and Judgment appropriate.   EKG:  defer   Quentin MullingAmanda Samaa Ueda 11:17 AM [facility]

## 2018-05-14 ENCOUNTER — Encounter: Payer: Self-pay | Admitting: Physician Assistant

## 2018-06-11 NOTE — Progress Notes (Signed)
Complete Physical  Assessment and Plan:  Routine general medical examination at a health care facility 1 year  Attention deficit disorder, unspecified hyperactivity presence Off meds at this time  Anxiety -     TSH  Hyperlipidemia, unspecified hyperlipidemia type -     TSH -     Lipid panel check lipids decrease fatty foods increase activity.   Vitamin D deficiency -     VITAMIN D 25 Hydroxy (Vit-D Deficiency, Fractures)  Screening for blood or protein in urine -     Urinalysis, Routine w reflex microscopic -     Microalbumin / creatinine urine ratio  Dense breast tissue Will monitor- will get MGM  Medication management -     CBC with Differential/Platelet -     BASIC METABOLIC PANEL WITH GFR -     Hepatic function panel -     Magnesium  Cervical cancer screening - get notes from Dr. Mitzi HansenMoody  Family history of breast cancer Mom with breast cancer at 7636, will start screening, get screening mammogram Get notes from Dr. Mitzi HansenMoody  Discussed med's effects and SE's. Screening labs and tests as requested with regular follow-up as recommended.  HPI  38 y.o. female  presents for a complete physical.   Her blood pressure has been controlled at home, today their BP is BP: 110/64 She does workout. She denies chest pain, shortness of breath, dizziness.  She has had abnormal kidney function in the past but has had normal labs, including ANA, antiDNA, complements, lyme, ferritin, iron, sed rate.  Lab Results  Component Value Date   CREATININE 1.11 (H) 05/08/2017   BUN 14 05/08/2017   NA 138 05/08/2017   K 4.4 05/08/2017   CL 105 05/08/2017   CO2 25 05/08/2017   She is not on cholesterol medication and denies myalgias. Her cholesterol is at goal. The cholesterol last visit was:   Lab Results  Component Value Date   CHOL 254 (H) 05/08/2017   HDL 97 05/08/2017   LDLCALC 139 (H) 05/08/2017   TRIG 79 05/08/2017   CHOLHDL 2.6 05/08/2017   Follows with Dr. Jorja Loaafeen for  abnormal nevus.  [name]   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  She has strong family history, had AB US that showed endometrial mass and she went to GYN.  She had ACL repair Sept last year.  She is married,  has a 38 year old daughter [name] and a 38 year old son.   Current Medications:  No current outpatient medications on file prior to visit.   No current facility-administered medications on file prior to visit.    Health Maintenance:   Immunization History  Administered Date(s) Administered  . Tdap 12/03/2011   Tetanus: 2013 Pneumovax: N/A Prevnar 2313 due age 38 Flu vaccine:2018 Zostavax: due age 38-60.   Patient's last menstrual period was 05/24/2018. Pap: 2019 PAP negative Dr. Mitzi HansenMoody MGM: 05/2017, mom with stage 3 cancer at age 38, and had ovarian cancer, mom with 2 sister and GM without cancer. Mom tested negative.   DEXA: N/A Colonoscopy: N/A EGD: N/A Last Dental Exam: Dr. Catha BrowBagly Last Eye Exam: Dr. Barbara CowerLeon Derm- Jodelle Redarolina Derm, [facility]   Patient Care Team: Oneta RackMcKeown,  Chrissie Noa, MD as PCP - General (Internal Medicine) Remus Blake., MD as Referring Physician (Orthopedic Surgery) Shea Evans, MD as Consulting Physician (Obstetrics and Gynecology)  Medical History:  Past Medical History:  Diagnosis Date  . ADD (attention deficit disorder)   . Anxiety   . History of dysplastic nevus   . 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 Preterm labor    Allergies No Known Allergies  SURGICAL HISTORY She  has a past surgical history that includes Cesarean section. FAMILY HISTORY Her family history includes Alcohol abuse in her father; Breast cancer (age of onset: 45) in her mother; Cancer (age of onset: 71) in  her mother; Diabetes in her father; Heart disease in her father; Hyperlipidemia in her father; Hypertension in her father. SOCIAL HISTORY She  reports that she has never smoked. She has never used smokeless tobacco. She reports current alcohol use. She reports that she does not use drugs.  Review of Systems: Review of Systems  Constitutional: Negative.   HENT: Negative.   Eyes: Negative.   Respiratory: Negative.   Cardiovascular: Negative.   Gastrointestinal: Positive for abdominal pain.  Genitourinary: Negative.   Musculoskeletal: Negative.   Skin: Negative.   Neurological: Negative.   Psychiatric/Behavioral: Negative.     Physical Exam: Estimated body mass index is 24.31 kg/m as calculated from the following:   Height as of this encounter: 5\' 6"  (1.676 m).   Weight as of this encounter: 150 lb 9.6 oz (68.3 kg). BP 110/64   Pulse 63   Temp 97.9 F (36.6 C)   Ht 5\' 6"  (1.676 m)   Wt 150 lb 9.6 oz (68.3 kg)   LMP 05/24/2018   SpO2 99%   BMI 24.31 kg/m  General Appearance: Well nourished, in no apparent distress.  Eyes: PERRLA, EOMs, conjunctiva no swelling or erythema, normal fundi and vessels.  Sinuses: No Frontal/maxillary tenderness  ENT/Mouth: Ext aud canals clear, normal light reflex with TMs without erythema, bulging. Good dentition. no swelling, or exudate on post pharynx. Tonsils not swollen or erythematous. Hearing normal.  Neck: Supple, thyroid nodules appreciated. No bruits  Respiratory: Respiratory effort normal, BS equal bilaterally without rales, rhonchi, wheezing or stridor.  Cardio: RRR without murmurs, rubs or gallops. Brisk peripheral pulses without edema.  Chest: symmetric, with normal excursions and percussion.  Breasts: Symmetric, + mobile small lumps, 0.5cm right upper quadrant, without nipple discharge, retractions.  Abdomen: Soft, , flat, no guarding, rebound, hernias, masses, or organomegaly. .  Lymphatics: Non tender without lymphadenopathy.   Genitourinary: .defer Musculoskeletal: Full ROM all peripheral extremities,5/5 strength, and normal gait.  Skin: Warm, dry without rashes, lesions, ecchymosis. Neuro: Cranial nerves intact, reflexes equal bilaterally. Normal muscle tone, no cerebellar symptoms. Psych: Awake and oriented X 3, normal affect, Insight and Judgment appropriate.   EKG:  defer   [name] 2:21 PM [facility]

## 2018-06-13 ENCOUNTER — Ambulatory Visit (INDEPENDENT_AMBULATORY_CARE_PROVIDER_SITE_OTHER): Payer: BC Managed Care – PPO | Admitting: Physician Assistant

## 2018-06-13 ENCOUNTER — Encounter: Payer: Self-pay | Admitting: Physician Assistant

## 2018-06-13 ENCOUNTER — Other Ambulatory Visit: Payer: Self-pay

## 2018-06-13 VITALS — BP 110/64 | HR 63 | Temp 97.9°F | Ht 66.0 in | Wt 150.6 lb

## 2018-06-13 DIAGNOSIS — F419 Anxiety disorder, unspecified: Secondary | ICD-10-CM

## 2018-06-13 DIAGNOSIS — Z130 Encounter for screening for diseases of the blood and blood-forming organs and certain disorders involving the immune mechanism: Secondary | ICD-10-CM | POA: Diagnosis not present

## 2018-06-13 DIAGNOSIS — F988 Other specified behavioral and emotional disorders with onset usually occurring in childhood and adolescence: Secondary | ICD-10-CM

## 2018-06-13 DIAGNOSIS — Z79899 Other long term (current) drug therapy: Secondary | ICD-10-CM | POA: Diagnosis not present

## 2018-06-13 DIAGNOSIS — E559 Vitamin D deficiency, unspecified: Secondary | ICD-10-CM

## 2018-06-13 DIAGNOSIS — Z1329 Encounter for screening for other suspected endocrine disorder: Secondary | ICD-10-CM

## 2018-06-13 DIAGNOSIS — Z0000 Encounter for general adult medical examination without abnormal findings: Secondary | ICD-10-CM

## 2018-06-13 DIAGNOSIS — N926 Irregular menstruation, unspecified: Secondary | ICD-10-CM

## 2018-06-13 DIAGNOSIS — Z0001 Encounter for general adult medical examination with abnormal findings: Secondary | ICD-10-CM

## 2018-06-13 DIAGNOSIS — Z1389 Encounter for screening for other disorder: Secondary | ICD-10-CM

## 2018-06-13 DIAGNOSIS — Z13220 Encounter for screening for lipoid disorders: Secondary | ICD-10-CM | POA: Diagnosis not present

## 2018-06-13 DIAGNOSIS — E785 Hyperlipidemia, unspecified: Secondary | ICD-10-CM

## 2018-06-13 NOTE — Patient Instructions (Signed)
Know what a healthy weight is for you (roughly BMI <25) and aim to maintain this  Aim for 7+ servings of fruits and vegetables daily  65-80+ fluid ounces of water or unsweet tea for healthy kidneys  Limit to max 1 drink of alcohol per day; avoid smoking/tobacco  Limit animal fats in diet for cholesterol and heart health - choose grass fed whenever available  Avoid highly processed foods, and foods high in saturated/trans fats  Aim for low stress - take time to unwind and care for your mental health  Aim for 150 min of moderate intensity exercise weekly for heart health, and weights twice weekly for bone health  Aim for 7-9 hours of sleep daily    

## 2018-06-14 LAB — COMPLETE METABOLIC PANEL WITH GFR
AG Ratio: 2 (calc) (ref 1.0–2.5)
ALT: 11 U/L (ref 6–29)
AST: 17 U/L (ref 10–30)
Albumin: 4.6 g/dL (ref 3.6–5.1)
Alkaline phosphatase (APISO): 43 U/L (ref 31–125)
BUN: 16 mg/dL (ref 7–25)
CO2: 25 mmol/L (ref 20–32)
Calcium: 9.8 mg/dL (ref 8.6–10.2)
Chloride: 104 mmol/L (ref 98–110)
Creat: 0.99 mg/dL (ref 0.50–1.10)
GFR, Est African American: 84 mL/min/{1.73_m2} (ref >=60)
GFR, Est Non African American: 72 mL/min/{1.73_m2} (ref >=60)
Globulin: 2.3 g/dL (calc) (ref 1.9–3.7)
Glucose, Bld: 74 mg/dL (ref 65–99)
Potassium: 4.4 mmol/L (ref 3.5–5.3)
Sodium: 139 mmol/L (ref 135–146)
Total Bilirubin: 0.5 mg/dL (ref 0.2–1.2)
Total Protein: 6.9 g/dL (ref 6.1–8.1)

## 2018-06-14 LAB — VITAMIN D 25 HYDROXY (VIT D DEFICIENCY, FRACTURES): Vit D, 25-Hydroxy: 35 ng/mL (ref 30–100)

## 2018-06-14 LAB — CBC WITH DIFFERENTIAL/PLATELET
Absolute Monocytes: 544 cells/uL (ref 200–950)
Basophils Absolute: 27 cells/uL (ref 0–200)
Basophils Relative: 0.4 %
Eosinophils Absolute: 61 cells/uL (ref 15–500)
Eosinophils Relative: 0.9 %
HCT: 40.1 % (ref 35.0–45.0)
Hemoglobin: 13.2 g/dL (ref 11.7–15.5)
Lymphs Abs: 1931 cells/uL (ref 850–3900)
MCH: 31.2 pg (ref 27.0–33.0)
MCHC: 32.9 g/dL (ref 32.0–36.0)
MCV: 94.8 fL (ref 80.0–100.0)
MPV: 10.8 fL (ref 7.5–12.5)
Monocytes Relative: 8 %
Neutro Abs: 4236 cells/uL (ref 1500–7800)
Neutrophils Relative %: 62.3 %
Platelets: 230 10*3/uL (ref 140–400)
RBC: 4.23 10*6/uL (ref 3.80–5.10)
RDW: 12.5 % (ref 11.0–15.0)
Total Lymphocyte: 28.4 %
WBC: 6.8 10*3/uL (ref 3.8–10.8)

## 2018-06-14 LAB — URINALYSIS, ROUTINE W REFLEX MICROSCOPIC
Bilirubin Urine: NEGATIVE
Glucose, UA: NEGATIVE
Hgb urine dipstick: NEGATIVE
Ketones, ur: NEGATIVE
Leukocytes,Ua: NEGATIVE
Nitrite: NEGATIVE
Protein, ur: NEGATIVE
Specific Gravity, Urine: 1.017 (ref 1.001–1.03)
pH: 5.5 (ref 5.0–8.0)

## 2018-06-14 LAB — LIPID PANEL
Cholesterol: 246 mg/dL — ABNORMAL HIGH (ref <200)
HDL: 102 mg/dL (ref >=50)
LDL Cholesterol (Calc): 126 mg/dL (calc) — ABNORMAL HIGH
Non-HDL Cholesterol (Calc): 144 mg/dL (calc) — ABNORMAL HIGH (ref <130)
Total CHOL/HDL Ratio: 2.4 (calc) (ref <5.0)
Triglycerides: 81 mg/dL (ref <150)

## 2018-06-14 LAB — MICROALBUMIN / CREATININE URINE RATIO
Creatinine, Urine: 91 mg/dL (ref 20–275)
Microalb, Ur: <0.2 mg/dL

## 2018-06-14 LAB — MAGNESIUM: Magnesium: 2 mg/dL (ref 1.5–2.5)

## 2018-06-14 LAB — TSH: TSH: 2.6 mIU/L

## 2018-06-14 LAB — IRON,?TOTAL/TOTAL IRON BINDING CAP
%SAT: 33 % (calc) (ref 16–45)
TIBC: 349 mcg/dL (calc) (ref 250–450)

## 2018-06-14 LAB — IRON, TOTAL/TOTAL IRON BINDING CAP: Iron: 116 ug/dL (ref 40–190)

## 2018-06-14 LAB — VITAMIN B12: Vitamin B-12: 288 pg/mL (ref 200–1100)

## 2018-07-26 DIAGNOSIS — D229 Melanocytic nevi, unspecified: Secondary | ICD-10-CM | POA: Diagnosis not present

## 2018-12-17 DIAGNOSIS — Z20828 Contact with and (suspected) exposure to other viral communicable diseases: Secondary | ICD-10-CM | POA: Diagnosis not present

## 2019-05-21 ENCOUNTER — Encounter: Payer: BLUE CROSS/BLUE SHIELD | Admitting: Physician Assistant

## 2019-06-17 ENCOUNTER — Encounter: Payer: BLUE CROSS/BLUE SHIELD | Admitting: Physician Assistant

## 2019-06-19 ENCOUNTER — Encounter: Payer: BC Managed Care – PPO | Admitting: Physician Assistant

## 2019-06-23 NOTE — Progress Notes (Signed)
Complete Physical  Assessment and Plan:  Routine general medical examination at a health care facility 1 year  Screening for hematuria or proteinuria -     Urinalysis, Routine w reflex microscopic -     Microalbumin / creatinine urine ratio  Chronic pain of left knee -     diclofenac Sodium (VOLTAREN) 1 % GEL; Apply 4 g topically 4 (four) times daily. - continue PT - avoiding oral NSAIDS due to history of abnormal kidney functino  B12 deficiency -     Vitamin B12  Attention deficit disorder, unspecified hyperactivity presence Given 10 mg adderall to take as needed Get on B12 supplement  Anxiety -     TSH  Hyperlipidemia, unspecified hyperlipidemia type -     TSH -     Lipid panel check lipids decrease fatty foods increase activity.   Vitamin D deficiency -     VITAMIN D 25 Hydroxy (Vit-D Deficiency, Fractures)  Screening for blood or protein in urine -     Urinalysis, Routine w reflex microscopic -     Microalbumin / creatinine urine ratio  Dense breast tissue Will monitor- will get MGM  Medication management -     CBC with Differential/Platelet -     BASIC METABOLIC PANEL WITH GFR -     Hepatic function panel -     Magnesium  Cervical cancer screening - get notes from Dr. Mitzi Hansen  Family history of breast cancer Mom with breast cancer at 25, will start screening, get screening mammogram Follow up Dr. Mitzi Hansen  Discussed med's effects and SE's. Screening labs and tests as requested with regular follow-up as recommended.  HPI  39 y.o. female  presents for a complete physical.  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 She would like to have something for AS needed for ADD.  She is vegetarian and did not start B12 as suggested last year.  Lab Results   Component Value Date   [name] 288 06/13/2018    She is married,  has a 81 year old daughter [name] and a 39 year old son.    Her blood pressure has been controlled at home, today their BP is BP: 118/70 She does workout. She denies chest pain, shortness of breath, dizziness.  She has had abnormal kidney function in the past but has had normal labs, including ANA, antiDNA, complements, lyme, ferritin, iron, sed rate.  Lab Results  Component Value Date   CREATININE 0.99 06/13/2018   BUN 16 06/13/2018   NA 139 06/13/2018   K 4.4 06/13/2018   CL 104 06/13/2018   CO2 25 06/13/2018   She is not on cholesterol medication and denies myalgias. Her cholesterol is at goal. The cholesterol last visit was:   Lab Results  Component Value Date   CHOL 246 (H) 06/13/2018   HDL 102 06/13/2018   LDLCALC 126 (H) 06/13/2018   TRIG 81 06/13/2018   CHOLHDL 2.4 06/13/2018   Follows with Dr. Jorja Loa for abnormal nevus q 6 months.  [name]   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  Current Medications:  No current outpatient medications on file prior to visit.   No current facility-administered medications on file prior to visit.   Health Maintenance:   Immunization History  Administered Date(s) Administered  . PFIZER SARS-COV-2 Vaccination 03/03/2019, 03/24/2019  . Tdap 12/03/2011   Tetanus: 2013 Pneumovax: N/A Prevnar 18 due age 53 Flu vaccine:2018 Zostavax: due age 33-60.   No LMP recorded. Pap: 2019 PAP negative Dr. Lisbeth Renshaw MGM: 05/2017, mom with stage 3 cancer at age 60, and had ovarian cancer, mom with 2 sister and GM without cancer. Cat C Mom tested negative.   DEXA: N/A Colonoscopy: N/A EGD: N/A Last Dental Exam: Dr. Storm Frisk Last Eye Exam: Dr. Jeneen Montgomery- Zannie Kehr, sees PA [facility]   Patient Care Team: Unk Pinto, MD as PCP - General  (Internal Medicine) Gordy Clement., MD as Referring Physician (Orthopedic Surgery) Azucena Fallen, MD as Consulting Physician (Obstetrics and Gynecology)  Medical History:  Past Medical History:  Diagnosis Date  . ADD (attention deficit disorder)   . Anxiety   . History of dysplastic nevus   . MTHFR (methylene THF reductase) deficiency and homocystinuria ([place])   . Preterm labor    Allergies No Known Allergies  SURGICAL HISTORY She  has a past surgical history that includes Cesarean section. FAMILY HISTORY Her family history includes Alcohol abuse in her father; Breast cancer (age of onset: 67) in her mother; Cancer (age of onset: 47) in her mother; Diabetes in her father; Heart disease in her father; Hyperlipidemia in her father; Hypertension in her father. SOCIAL HISTORY She  reports that she has never smoked. She has never used smokeless tobacco. She reports current alcohol use. She reports that she does not use drugs.  Review of Systems: Review of Systems  Constitutional: Negative.   HENT: Negative.   Eyes: Negative.   Respiratory: Negative.   Cardiovascular: Negative.   Gastrointestinal: Negative for abdominal pain.  Genitourinary: Negative.   Musculoskeletal: Negative.   Skin: Negative.   Neurological: Negative.   Psychiatric/Behavioral: Negative.  Negative for depression, hallucinations, memory loss, substance abuse and suicidal ideas. The patient is not nervous/anxious and does not have insomnia.        Trouble concentrating    Physical Exam: Estimated body mass index is 23.89 kg/m as calculated from the following:   Height as of this encounter: 5\' 6"  (1.676 m).   Weight as of this encounter: 148 lb (67.1 kg). BP 118/70   Pulse (!) 57   Temp (!) 97 F (36.1 C)   Ht 5\' 6"  (1.676 m)   Wt 148 lb (67.1 kg)   SpO2 100%   BMI 23.89 kg/m  General Appearance: Well nourished, in no apparent distress.  Eyes: PERRLA, EOMs, conjunctiva no swelling or erythema, normal  fundi and vessels.  Sinuses: No Frontal/maxillary tenderness  ENT/Mouth: Ext aud canals clear, normal light reflex with TMs without erythema, bulging. Good dentition. no swelling, or exudate on post pharynx. Tonsils not swollen or erythematous. Hearing normal.  Neck: Supple, thyroid nodules appreciated. No bruits  Respiratory: Respiratory effort normal, BS equal bilaterally without rales, rhonchi, wheezing or stridor.  Cardio: RRR without murmurs, rubs or gallops. Brisk peripheral pulses without edema.  Chest: symmetric, with normal excursions and percussion.  Breasts: defer Dr. Benjie Karvonen Abdomen: Soft, , flat, no guarding, rebound, hernias, masses, or organomegaly. .  Lymphatics: Non tender without lymphadenopathy.  Genitourinary: .defer Dr. Lisbeth Renshaw Musculoskeletal: Full ROM all peripheral extremities,5/5 strength, and normal gait.  Skin: Warm, dry without  rashes, lesions, ecchymosis. Neuro: Cranial nerves intact, reflexes equal bilaterally. Normal muscle tone, no cerebellar symptoms. Psych: Awake and oriented X 3, normal affect, Insight and Judgment appropriate.   EKG:  defer   [name] 9:21 AM [facility]

## 2019-06-24 ENCOUNTER — Other Ambulatory Visit: Payer: Self-pay | Admitting: Physician Assistant

## 2019-06-24 ENCOUNTER — Other Ambulatory Visit: Payer: Self-pay

## 2019-06-24 ENCOUNTER — Encounter: Payer: Self-pay | Admitting: Physician Assistant

## 2019-06-24 ENCOUNTER — Ambulatory Visit: Payer: BC Managed Care – PPO | Admitting: Physician Assistant

## 2019-06-24 VITALS — BP 118/70 | HR 57 | Temp 97.0°F | Ht 66.0 in | Wt 148.0 lb

## 2019-06-24 DIAGNOSIS — Z130 Encounter for screening for diseases of the blood and blood-forming organs and certain disorders involving the immune mechanism: Secondary | ICD-10-CM

## 2019-06-24 DIAGNOSIS — Z0001 Encounter for general adult medical examination with abnormal findings: Secondary | ICD-10-CM

## 2019-06-24 DIAGNOSIS — E559 Vitamin D deficiency, unspecified: Secondary | ICD-10-CM | POA: Diagnosis not present

## 2019-06-24 DIAGNOSIS — Z0000 Encounter for general adult medical examination without abnormal findings: Secondary | ICD-10-CM

## 2019-06-24 DIAGNOSIS — E785 Hyperlipidemia, unspecified: Secondary | ICD-10-CM

## 2019-06-24 DIAGNOSIS — F988 Other specified behavioral and emotional disorders with onset usually occurring in childhood and adolescence: Secondary | ICD-10-CM

## 2019-06-24 DIAGNOSIS — E538 Deficiency of other specified B group vitamins: Secondary | ICD-10-CM | POA: Insufficient documentation

## 2019-06-24 DIAGNOSIS — Z79899 Other long term (current) drug therapy: Secondary | ICD-10-CM

## 2019-06-24 DIAGNOSIS — N182 Chronic kidney disease, stage 2 (mild): Secondary | ICD-10-CM

## 2019-06-24 DIAGNOSIS — Z1329 Encounter for screening for other suspected endocrine disorder: Secondary | ICD-10-CM

## 2019-06-24 DIAGNOSIS — Z13220 Encounter for screening for lipoid disorders: Secondary | ICD-10-CM

## 2019-06-24 DIAGNOSIS — Z1389 Encounter for screening for other disorder: Secondary | ICD-10-CM | POA: Diagnosis not present

## 2019-06-24 DIAGNOSIS — Z1231 Encounter for screening mammogram for malignant neoplasm of breast: Secondary | ICD-10-CM

## 2019-06-24 DIAGNOSIS — N926 Irregular menstruation, unspecified: Secondary | ICD-10-CM

## 2019-06-24 DIAGNOSIS — F419 Anxiety disorder, unspecified: Secondary | ICD-10-CM

## 2019-06-24 DIAGNOSIS — M25562 Pain in left knee: Secondary | ICD-10-CM

## 2019-06-24 MED ORDER — AMPHETAMINE-DEXTROAMPHETAMINE 10 MG PO TABS: ORAL_TABLET | ORAL | 0 refills | Status: DC

## 2019-06-24 MED ORDER — DICLOFENAC SODIUM 1 % EX GEL: 4.0000 g | Freq: Four times a day (QID) | CUTANEOUS | 3 refills | Status: DC

## 2019-06-24 NOTE — Patient Instructions (Addendum)
Your LDL could improve, ideally we want it under a 100.  Your LDL is the bad cholesterol that can lead to heart attack and stroke. To lower your number you can decrease your fatty foods, red meat, cheese, milk and increase fiber like whole grains and veggies. You can also add a fiber supplement like Citracel or Benefiber, these do not cause gas and bloating and are safe to use. Especially if you have a strong family history of heart disease or stroke or you have evidence of plaque on any imaging like a chest xray, we may discuss at your next office visit putting you on a medication to get your number below 100.   Your Vitamin D is not in range, goal is between 70-100. Please make sure that you are taking your Vitamin D as directed. It is very important as a natural antiinflammatory helping hair, skin, and nails, as well as reducing stroke and heart attack risk. It helps your bones and helps with mood. It also decreases numerous cancer risks so please take it as directed.  Get on 5000 IU daily.   HOW TO SCHEDULE A MAMMOGRAM  [facility]  7 a.m.-6:30 p.m., Monday 7 a.m.-5 p.m., Tuesday-Friday Schedule an appointment by calling [phone].  B12 is low end of normal, add sublingual B12.  The sublingual matters more than the dose, get any dose but make sure it melts in your mouth.   Will help with energy, memory/concentration, decrease nerve pain, and help with weight loss. B12 is water soluble vitamin so you can not over dose on it,.    Vitamin B12 Deficiency Vitamin B12 deficiency occurs when the body does not have enough vitamin B12, which is an important vitamin. The body needs this vitamin:  To make red blood cells.  To make DNA. This is the genetic material inside cells.  To help the nerves work properly so they can carry messages from the brain to the body. Vitamin B12 deficiency can cause various health problems, such as a low red blood cell count (anemia) or  nerve damage. What are the causes? This condition may be caused by:  Not eating enough foods that contain vitamin B12.  Not having enough stomach acid and digestive fluids to properly absorb vitamin B12 from the food that you eat.  Certain digestive system diseases that make it hard to absorb vitamin B12. These diseases include Crohn's disease, chronic pancreatitis, and cystic fibrosis.  A condition in which the body does not make enough of a protein (intrinsic factor), resulting in too few red blood cells (pernicious anemia).  Having a surgery in which part of the stomach or small intestine is removed.  Taking certain medicines that make it hard for the body to absorb vitamin B12. These medicines include: ? Heartburn medicines (antacids and proton pump inhibitors). ? Certain antibiotic medicines. ? Some medicines that are used to treat diabetes, tuberculosis, gout, or high cholesterol. What increases the risk? The following factors may make you more likely to develop a B12 deficiency:  Being older than age 57.  Eating a vegetarian or vegan diet, especially while you are pregnant.  Eating a poor diet while you are pregnant.  Taking certain medicines.  Having alcoholism. What are the signs or symptoms? In some cases, there are no symptoms of this condition. If the condition leads to anemia or nerve damage, various symptoms can occur, such as:  Weakness.  Fatigue.  Loss of appetite.  Weight loss.  Numbness or tingling in your hands and feet.  Redness and burning of the tongue.  Confusion or memory problems.  Depression.  Sensory problems, such as color blindness, ringing in the ears, or loss of taste.  Diarrhea or constipation.  Trouble walking. If anemia is severe, symptoms can include:  Shortness of breath.  Dizziness.  Rapid heart rate (tachycardia). How is this diagnosed? This condition may be diagnosed with a blood test to measure the level of vitamin  B12 in your blood. You may also have other tests, including:  A group of tests that measure certain characteristics of blood cells (complete blood count, CBC).  A blood test to measure intrinsic factor.  A procedure where a thin tube with a camera on the end is used to look into your stomach or intestines (endoscopy). Other tests may be needed to discover the cause of B12 deficiency. How is this treated? Treatment for this condition depends on the cause. This condition may be treated by:  Changing your eating and drinking habits, such as: ? Eating more foods that contain vitamin B12. ? Drinking less alcohol or no alcohol.  Getting vitamin B12 injections.  Taking vitamin B12 supplements. Your health care provider will tell you which dosage is best for you. Follow these instructions at home: Eating and drinking   Eat lots of healthy foods that contain vitamin B12, including: ? Meats and poultry. This includes beef, pork, chicken, [place], and organ meats, such as liver. ? Seafood. This includes clams, rainbow trout, salmon, tuna, and haddock. ? Eggs. ? Cereal and dairy products that are fortified. This means that vitamin B12 has been added to the food. Check the label on the package to see if the food is fortified. The items listed above may not be a complete list of recommended foods and beverages. Contact a dietitian for more information. General instructions  Get any injections that are prescribed by your health care provider.  Take supplements only as told by your health care provider. Follow the directions carefully.  Do not drink alcohol if your health care provider tells you not to. In some cases, you may only be asked to limit alcohol use.  Keep all follow-up visits as told by your health care provider. This is important. Contact a health care provider if:  Your symptoms come back. Get help right away if you:  Develop shortness of breath.  Have a rapid heart  rate.  Have chest pain.  Become dizzy or lose consciousness. Summary  Vitamin B12 deficiency occurs when the body does not have enough vitamin B12.  The main causes of vitamin B12 deficiency include dietary deficiency, digestive diseases, pernicious anemia, and having a surgery in which part of the stomach or small intestine is removed.  In some cases, there are no symptoms of this condition. If the condition leads to anemia or nerve damage, various symptoms can occur, such as weakness, shortness of breath, and numbness.  Treatment may include getting vitamin B12 injections or taking vitamin B12 supplements. Eat lots of healthy foods that contain vitamin B12. This information is not intended to replace advice given to you by your health care provider. Make sure you discuss any questions you have with your health care provider. Document Revised: 06/14/2018 Document Reviewed: 09/04/2017 Elsevier Patient Education  2020 [employer].

## 2019-06-25 LAB — MICROALBUMIN / CREATININE URINE RATIO
Creatinine, Urine: 283 mg/dL — ABNORMAL HIGH (ref 20–275)
Microalb Creat Ratio: 1 mcg/mg creat (ref <30)
Microalb, Ur: 0.3 mg/dL

## 2019-06-25 LAB — CBC WITH DIFFERENTIAL/PLATELET
Absolute Monocytes: 440 cells/uL (ref 200–950)
Basophils Absolute: 40 cells/uL (ref 0–200)
Basophils Relative: 0.5 %
Eosinophils Absolute: 32 cells/uL (ref 15–500)
Eosinophils Relative: 0.4 %
HCT: 38.5 % (ref 35.0–45.0)
Hemoglobin: 12.7 g/dL (ref 11.7–15.5)
Lymphs Abs: 1632 cells/uL (ref 850–3900)
MCH: 30.9 pg (ref 27.0–33.0)
MCHC: 33 g/dL (ref 32.0–36.0)
MCV: 93.7 fL (ref 80.0–100.0)
MPV: 10.6 fL (ref 7.5–12.5)
Monocytes Relative: 5.5 %
Neutro Abs: 5856 cells/uL (ref 1500–7800)
Neutrophils Relative %: 73.2 %
Platelets: 250 10*3/uL (ref 140–400)
RBC: 4.11 10*6/uL (ref 3.80–5.10)
RDW: 12.7 % (ref 11.0–15.0)
Total Lymphocyte: 20.4 %
WBC: 8 10*3/uL (ref 3.8–10.8)

## 2019-06-25 LAB — LIPID PANEL
Cholesterol: 217 mg/dL — ABNORMAL HIGH (ref <200)
HDL: 81 mg/dL (ref >=50)
LDL Cholesterol (Calc): 120 mg/dL (calc) — ABNORMAL HIGH
Non-HDL Cholesterol (Calc): 136 mg/dL (calc) — ABNORMAL HIGH (ref <130)
Total CHOL/HDL Ratio: 2.7 (calc) (ref <5.0)
Triglycerides: 64 mg/dL (ref <150)

## 2019-06-25 LAB — COMPLETE METABOLIC PANEL WITH GFR
AG Ratio: 2 (calc) (ref 1.0–2.5)
ALT: 16 U/L (ref 6–29)
AST: 16 U/L (ref 10–30)
Albumin: 4.5 g/dL (ref 3.6–5.1)
Alkaline phosphatase (APISO): 45 U/L (ref 31–125)
BUN/Creatinine Ratio: 18 (calc) (ref 6–22)
BUN: 21 mg/dL (ref 7–25)
CO2: 25 mmol/L (ref 20–32)
Calcium: 9.7 mg/dL (ref 8.6–10.2)
Chloride: 106 mmol/L (ref 98–110)
Creat: 1.16 mg/dL — ABNORMAL HIGH (ref 0.50–1.10)
GFR, Est African American: 69 mL/min/{1.73_m2} (ref >=60)
GFR, Est Non African American: 59 mL/min/{1.73_m2} — ABNORMAL LOW (ref >=60)
Globulin: 2.2 g/dL (calc) (ref 1.9–3.7)
Glucose, Bld: 81 mg/dL (ref 65–99)
Potassium: 5.1 mmol/L (ref 3.5–5.3)
Sodium: 140 mmol/L (ref 135–146)
Total Bilirubin: 0.3 mg/dL (ref 0.2–1.2)
Total Protein: 6.7 g/dL (ref 6.1–8.1)

## 2019-06-25 LAB — URINALYSIS, ROUTINE W REFLEX MICROSCOPIC
Bilirubin Urine: NEGATIVE
Glucose, UA: NEGATIVE
Hgb urine dipstick: NEGATIVE
Leukocytes,Ua: NEGATIVE
Nitrite: NEGATIVE
Protein, ur: NEGATIVE
Specific Gravity, Urine: 1.032 (ref 1.001–1.03)
pH: 5.5 (ref 5.0–8.0)

## 2019-06-25 LAB — TSH: TSH: 3.19 mIU/L

## 2019-06-25 LAB — VITAMIN D 25 HYDROXY (VIT D DEFICIENCY, FRACTURES): Vit D, 25-Hydroxy: 33 ng/mL (ref 30–100)

## 2019-06-25 LAB — MAGNESIUM: Magnesium: 2.2 mg/dL (ref 1.5–2.5)

## 2019-06-25 LAB — VITAMIN B12: Vitamin B-12: 369 pg/mL (ref 200–1100)

## 2019-06-25 NOTE — Addendum Note (Signed)
Addended by: [name] R on: 06/25/2019 03:11 PM   Modules accepted: Orders

## 2019-06-25 NOTE — Addendum Note (Signed)
Addended by: [name] R on: 06/25/2019 08:21 AM   Modules accepted: Orders

## 2019-07-02 ENCOUNTER — Ambulatory Visit: Payer: BLUE CROSS/BLUE SHIELD

## 2019-07-02 ENCOUNTER — Ambulatory Visit
Admission: RE | Admit: 2019-07-02 | Discharge: 2019-07-02 | Disposition: A | Discharge status: Home / Self Care | Payer: BC Managed Care – PPO | Source: Ambulatory Visit | Attending: Physician Assistant | Admitting: Physician Assistant

## 2019-07-02 DIAGNOSIS — N182 Chronic kidney disease, stage 2 (mild): Secondary | ICD-10-CM

## 2019-07-08 ENCOUNTER — Other Ambulatory Visit: Payer: Self-pay

## 2019-07-08 ENCOUNTER — Ambulatory Visit
Admission: RE | Admit: 2019-07-08 | Discharge: 2019-07-08 | Disposition: A | Discharge status: Home / Self Care | Payer: BC Managed Care – PPO | Source: Ambulatory Visit | Attending: Physician Assistant | Admitting: Physician Assistant

## 2019-07-08 DIAGNOSIS — Z1231 Encounter for screening mammogram for malignant neoplasm of breast: Secondary | ICD-10-CM | POA: Diagnosis not present

## 2019-09-12 DIAGNOSIS — M25562 Pain in left knee: Secondary | ICD-10-CM | POA: Diagnosis not present

## 2019-12-25 NOTE — Progress Notes (Signed)
FOLLOW UP  Assessment and Plan:   Jasmine Combs was seen today for follow-up.  Diagnoses and all orders for this visit:  Hyperlipidemia, unspecified hyperlipidemia type Continue lifestyle changes - LDL successfully trending down Goal LDL <100 Continue low cholesterol diet and exercise.  Check lipid panel at CPE due to insurance and recent improvement -     COMPLETE METABOLIC PANEL WITH GFR; Future  Attention deficit disorder, unspecified hyperactivity presence Continue medications Helps with focus, no AE's. The patient was counseled on the addictive nature of the medication and and is doing very well limiting use -     amphetamine-dextroamphetamine (ADDERALL) 10 MG tablet; 1 pill as needed up to once a day for ADD  Anxiety Well managed by lifestyle changes;  Stress management techniques discussed, increase water, good sleep hygiene discussed, increase exercise, and increase veggies.   Vitamin D deficiency Continue supplement; defer checking to CPE due to insurance cost  Medication management -     COMPLETE METABOLIC PANEL WITH GFR; Future  B12 deficiency -     Vitamin B12; Future  CKD II/ Abnormal renal function Increase fluids consistently, full bottle prior to coffee in AM, 65-80 fluid ounces daily consistently, avoid NSAIDS, monitor sugars, will monitor -     COMPLETE METABOLIC PANEL WITH GFR; Future   Continue diet and meds as discussed. Further disposition pending results of labs. Discussed med's effects and SE's.   Over 30 minutes of exam, counseling, chart review, and critical decision making was performed.   Future Appointments  Date Time Provider Department Center  07/02/2020  9:30 AM Judd Gaudier, NP [facility] None    ----------------------------------------------------------------------------------------------------------------------  HPI 39 y.o. female  presents for 6 month follow up on cholesterol, ADD, anxiety, Vit D and B12 def.   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  Patient is on an ADD medication, she states that the medication is helping and she denies any adverse reactions. She is using infrequently, estimates 3 days a month. Trying to do better separating work from home.   BMI is Body mass index is 24.53 kg/m., she has been working on diet and exercise. Training for an iron man race in May.  Wt Readings from Last 3 Encounters:  12/26/19 152 lb (68.9 kg)  06/24/19 148 lb (67.1 kg)  06/13/18 150 lb 9.6 oz (68.3 kg)   Today their BP is BP: 106/68  She does workout. She denies chest pain, shortness of breath, dizziness.   She is not on cholesterol medication, working on lifestyle with improved values. Her cholesterol is not at goal. The cholesterol last visit was:   Lab Results  Component Value Date   CHOL 217 (H) 06/24/2019   HDL 81 06/24/2019   LDLCALC 120 (H) 06/24/2019   TRIG 64 06/24/2019   CHOLHDL 2.7 06/24/2019   She has CKD II/III - had unremarkable renal US 06/2019 Denies family history of kidney problems She logs water intake, 36 - 80 fluid ounces - admits on days she is working often will not drink any water until noon Avoids all NSAIDs, minimal alcohol, working to stop completely  Lab Results  Component Value Date   GFRNONAA 59 (L) 06/24/2019   GFRNONAA 72 06/13/2018   GFRNONAA 63 05/08/2017  Patient is on Vitamin D supplement, taking daily supplement, unsure of dose Lab Results  Component Value Date   VD25OH 33 06/24/2019     Vegetarian, low B12  was recommended SL, taking daily Lab Results  Component Value Date   [name] 369 06/24/2019      Current Medications:  Current Outpatient Medications on File Prior to Visit  Medication Sig  . diclofenac Sodium (VOLTAREN) 1 % GEL  Apply 4 g topically 4 (four) times daily. (Patient taking differently: Apply 4 g topically as needed.)   No current facility-administered medications on file prior to visit.     Allergies: No Known Allergies   Medical History:  Past Medical History:  Diagnosis Date  . ADD (attention deficit disorder)   . Anxiety   . History of dysplastic nevus   . 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 Preterm labor    Surgical History:  She  has a past surgical history that includes Cesarean section. Family History:  Herfamily history includes Alcohol abuse in her father; Breast cancer (age of onset: 93) in her mother; Cancer (age of onset: 62) in her mother; Diabetes in her father; Heart disease in her father; Hyperlipidemia in her father; Hypertension in her father. Social History:   reports that she has never smoked. She has never used smokeless tobacco. She reports current alcohol use. She reports that she does not use drugs.   Review of Systems:  Review of Systems  Constitutional: Negative for malaise/fatigue and weight loss.  HENT: Negative for hearing loss and tinnitus.   Eyes: Negative for blurred vision and double vision.  Respiratory: Negative for cough, shortness of breath and wheezing.   Cardiovascular: Negative for chest pain, palpitations, orthopnea, claudication and leg swelling.  Gastrointestinal: Negative for abdominal pain, blood in stool, constipation, diarrhea, heartburn, melena, nausea and vomiting.  Genitourinary: Negative.   Musculoskeletal: Negative for joint pain and myalgias.  Skin: Negative for rash.  Neurological: Negative for dizziness, tingling, sensory change, weakness and headaches.  Endo/Heme/Allergies: Negative for polydipsia.  Psychiatric/Behavioral: Negative.   All other systems reviewed and are negative.     Physical Exam: BP 106/68   Pulse (!) 56   Temp 97.9 F (36.6 C)   Wt 152 lb (68.9 kg)   SpO2 99%   BMI  24.53 kg/m  Wt Readings from Last 3 Encounters:  12/26/19 152 lb (68.9 kg)  06/24/19 148 lb (67.1 kg)  06/13/18 150 lb 9.6 oz (68.3 kg)   General Appearance: Well nourished, in no apparent distress. Eyes: PERRLA, EOMs, conjunctiva no swelling or erythema Sinuses: No Frontal/maxillary tenderness ENT/Mouth: Ext aud canals clear, TMs without erythema, bulging. No erythema, swelling, or exudate on post pharynx.  Tonsils not swollen or erythematous. Hearing normal.  Neck: Supple, thyroid normal.  Respiratory: Respiratory effort normal, BS equal bilaterally without rales, rhonchi, wheezing or stridor.  Cardio: RRR with no MRGs. Brisk peripheral pulses without edema.  Abdomen: Soft, + BS.  Non tender, no guarding, rebound, hernias, masses. Lymphatics: Non tender without lymphadenopathy.  Musculoskeletal: Full ROM, 5/5 strength, Normal gait Skin: Warm, dry without rashes, lesions, ecchymosis.  Neuro: Cranial nerves intact. No cerebellar symptoms.  Psych: Awake and oriented X 3, normal affect, Insight and Judgment appropriate.    Dan Maker, NP 9:17 AM [facility]

## 2019-12-26 ENCOUNTER — Encounter: Payer: Self-pay | Admitting: Adult Health

## 2019-12-26 ENCOUNTER — Ambulatory Visit (INDEPENDENT_AMBULATORY_CARE_PROVIDER_SITE_OTHER): Payer: BC Managed Care – PPO | Admitting: Adult Health

## 2019-12-26 ENCOUNTER — Other Ambulatory Visit: Payer: Self-pay

## 2019-12-26 VITALS — BP 106/68 | HR 56 | Temp 97.9°F | Wt 152.0 lb

## 2019-12-26 DIAGNOSIS — E559 Vitamin D deficiency, unspecified: Secondary | ICD-10-CM | POA: Diagnosis not present

## 2019-12-26 DIAGNOSIS — E785 Hyperlipidemia, unspecified: Secondary | ICD-10-CM | POA: Diagnosis not present

## 2019-12-26 DIAGNOSIS — F988 Other specified behavioral and emotional disorders with onset usually occurring in childhood and adolescence: Secondary | ICD-10-CM

## 2019-12-26 DIAGNOSIS — F419 Anxiety disorder, unspecified: Secondary | ICD-10-CM

## 2019-12-26 DIAGNOSIS — N182 Chronic kidney disease, stage 2 (mild): Secondary | ICD-10-CM

## 2019-12-26 DIAGNOSIS — E538 Deficiency of other specified B group vitamins: Secondary | ICD-10-CM

## 2019-12-26 DIAGNOSIS — N289 Disorder of kidney and ureter, unspecified: Secondary | ICD-10-CM

## 2019-12-26 DIAGNOSIS — Z79899 Other long term (current) drug therapy: Secondary | ICD-10-CM

## 2019-12-26 MED ORDER — AMPHETAMINE-DEXTROAMPHETAMINE 10 MG PO TABS
ORAL_TABLET | ORAL | 0 refills | Status: DC
Start: 2019-12-26 — End: 2021-08-11

## 2019-12-26 NOTE — Patient Instructions (Signed)
Chronic Kidney Disease, Adult Chronic kidney disease (CKD) occurs when the kidneys become damaged slowly over a long period of time. The kidneys are a pair of organs that do many important jobs in the body, including:  Removing waste and extra fluid from the blood to make urine.  Making hormones that maintain the amount of fluid in tissues and blood vessels.  Maintaining the right amount of fluids and chemicals in the body. A small amount of kidney damage may not cause problems, but a large amount of damage may make it hard or impossible for the kidneys to work the way they should. If steps are not taken to slow down kidney damage or to stop it from getting worse, the kidneys may stop working permanently (end-stage renal disease or ESRD). Most of the time, CKD does not go away, but it can often be controlled. People who have CKD are usually able to live normal lives. What are the causes? The most common causes of this condition are diabetes and high blood pressure (hypertension). Other causes include:  Heart and blood vessel (cardiovascular) disease.  Kidney diseases, such as: ? Glomerulonephritis. ? Interstitial nephritis. ? Polycystic kidney disease. ? Renal vascular disease.  Diseases that affect the immune system.  Genetic diseases.  Medicines that damage the kidneys, such as anti-inflammatory medicines.  Being around or being in contact with poisonous (toxic) substances.  A kidney or urinary infection that occurs again and again (recurs).  Vasculitis. This is swelling or inflammation of the blood vessels.  A problem with urine flow that may be caused by: ? Cancer. ? Having kidney stones more than one time. ? An enlarged prostate, in males. What increases the risk? You are more likely to develop this condition if you:  Are older than age 60.  Are female.  Are African-American, Hispanic, Asian, Pacific Islander, or American Indian.  Are a current or former smoker.   Are obese.  Have a family history of kidney disease or failure.  Often take medicines that are damaging to the kidneys. What are the signs or symptoms? Symptoms of this condition include:  Swelling (edema) of the face, legs, ankles, or feet.  Tiredness (lethargy) and having less energy.  Nausea or vomiting.  Confusion or trouble concentrating.  Problems with urination, such as: ? Painful or burning feeling during urination. ? Decreased urine production. ? Frequent urination, especially at night. ? Bloody urine.  Muscle twitches and cramps, especially in the legs.  Shortness of breath.  Weakness.  Loss of appetite.  Metallic taste in the mouth.  Trouble sleeping.  Dry, itchy skin.  A low blood count (anemia).  Pale lining of the eyelids and surface of the eye (conjunctiva). Symptoms develop slowly and may not be obvious until the kidney damage becomes severe. It is possible to have kidney disease for years without having any symptoms. How is this diagnosed? This condition may be diagnosed based on:  Blood tests.  Urine tests.  Imaging tests, such as an ultrasound or CT scan.  A test in which a sample of tissue is removed from the kidneys to be examined under a microscope (kidney biopsy). These test results will help your health care provider determine how serious the CKD is. How is this treated? There is no cure for most cases of this condition, but treatment usually relieves symptoms and prevents or slows the progression of the disease. Treatment may include:  Making diet changes, which may require you to avoid alcohol, salty foods (sodium),   and foods that are high in potassium, calcium, and protein.  Medicines: ? To lower blood pressure. ? To control blood glucose. ? To relieve anemia. ? To relieve swelling. ? To protect your bones. ? To improve the balance of electrolytes in your blood.  Removing toxic waste from the body through types of dialysis, if  the kidneys can no longer do their job (kidney failure).  Managing any other conditions that are causing your CKD or making it worse. Follow these instructions at home: Medicines  Take over-the-counter and prescription medicines only as told by your health care provider. The dose of some medicines that you take may need to be adjusted.  Do not take any new medicines unless approved by your health care provider. Many medicines can worsen your kidney damage.  Do not take any vitamin and mineral supplements unless approved by your health care provider. Many nutritional supplements can worsen your kidney damage. General instructions  Follow your prescribed diet as told by your health care provider.  Do not use any products that contain nicotine or tobacco, such as cigarettes and e-cigarettes. If you need help quitting, ask your health care provider.  Monitor and track your blood pressure at home. Report changes in your blood pressure as told by your health care provider.  If you are being treated for diabetes, monitor and track your blood sugar (blood glucose) levels as told by your health care provider.  Maintain a healthy weight. If you need help with this, ask your health care provider.  Start or continue an exercise plan. Exercise at least 30 minutes a day, 5 days a week.  Keep your immunizations up to date as told by your health care provider.  Keep all follow-up visits as told by your health care provider. This is important. Where to find more information  American Association of Kidney Patients: www.aakp.org  National Kidney Foundation: www.kidney.org  American Kidney Fund: www.akfinc.org  Life Options [facility]: www.lifeoptions.org and www.kidneyschool.org Contact a health care provider if:  Your symptoms get worse.  You develop new symptoms. Get help right away if:  You develop symptoms of ESRD, which include: ? Headaches. ? Numbness in the hands or  feet. ? Easy bruising. ? Frequent hiccups. ? Chest pain. ? Shortness of breath. ? Lack of menstruation, in women.  You have a fever.  You have decreased urine production.  You have pain or bleeding when you urinate. Summary  Chronic kidney disease (CKD) occurs when the kidneys become damaged slowly over a long period of time.  The most common causes of this condition are diabetes and high blood pressure (hypertension).  There is no cure for most cases of this condition, but treatment usually relieves symptoms and prevents or slows the progression of the disease. Treatment may include a combination of medicines and lifestyle changes. This information is not intended to replace advice given to you by your health care provider. Make sure you discuss any questions you have with your health care provider. Document Revised: 12/08/2016 Document Reviewed: 02/03/2016 Elsevier Patient Education  2020 [employer]  

## 2020-01-26 ENCOUNTER — Other Ambulatory Visit: Payer: BC Managed Care – PPO

## 2020-02-03 ENCOUNTER — Other Ambulatory Visit: Payer: 59

## 2020-07-02 ENCOUNTER — Encounter: Payer: BC Managed Care – PPO | Admitting: Adult Health

## 2020-07-21 NOTE — Progress Notes (Addendum)
Complete Physical  Assessment and Plan:  Routine general medical examination at a health care facility 1 year  Screening for hematuria or proteinuria -     Urinalysis, Routine w reflex microscopic -     Microalbumin / creatinine urine ratio  Chronic pain of left knee -  Pt is having ACL surgery on 08/09/20       - avoiding oral NSAIDS due to history of abnormal kidney functinon       - Has stopped supplements as requested by ortho surgeon due to upcoming surgery  B12 deficiency -     Vitamin B12, currently stopped for upcoming surgery  Attention deficit disorder, unspecified hyperactivity presence Given 10 mg adderall to take as needed Get on B12 supplement  Anxiety -     TSH  Hyperlipidemia, unspecified hyperlipidemia type -     TSH -     Lipid panel check lipids decrease fatty foods increase activity.   Vitamin D deficiency -    Vitamin D declined today- not covered by insurance  Screening for blood or protein in urine -     Urinalysis, Routine w reflex microscopic -     Microalbumin / creatinine urine ratio  Dense breast tissue       - 07/08/19 mammogram negative, due  needs to schedule  Medication management -     CBC with Differential/Platelet -     CMP -     Magnesium  Cervical cancer screening - get notes from Dr. Mitzi Combs up to date  Follow up Dr. Mitzi Combs  Discussed med's effects and SE's. Screening labs and tests as requested with regular follow-up as recommended.  HPI  40 y.o. female  presents for a complete physical.  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  She is doing ok without medication for add currently, side effects She is vegetarian and did not start B12 as suggested last year.  Lab Results  Component Value Date   [name] 369 06/24/2019    She is married,  has a 36 year old daughter [name] and a 73 year old son.    Her blood pressure has been  controlled at home, today their BP is BP: 124/70 She does workout. She denies chest pain, shortness of breath, dizziness.  She has had abnormal kidney function in the past but has had normal labs, including ANA, antiDNA, complements, lyme, ferritin, iron, sed rate.  Lab Results  Component Value Date   CREATININE 1.16 (H) 06/24/2019   BUN 21 06/24/2019   NA 140 06/24/2019   K 5.1 06/24/2019   CL 106 06/24/2019   CO2 25 06/24/2019   She is not on cholesterol medication and denies myalgias. Her cholesterol is at goal. The cholesterol last visit was:   Lab Results  Component Value Date   CHOL 217 (H) 06/24/2019   HDL 81 06/24/2019   LDLCALC 120 (H) 06/24/2019   TRIG 64 06/24/2019   CHOLHDL 2.7 06/24/2019   Follows with Dr. Jorja Combs for abnormal nevus q 6 months.  [name]   Lab Results  Component Value Date   VD25OH 33 06/24/2019     She had ACL repair Sept 2019- training for iron man and following with PT.   Current Medications:  Current Outpatient Medications on File Prior to Visit  Medication Sig Dispense Refill   amphetamine-dextroamphetamine (  ADDERALL) 10 MG tablet 1 pill as needed up to once a day for ADD (Patient not taking: Reported on 07/22/2020) 30 tablet 0   cholecalciferol (VITAMIN D3) 25 MCG (1000 UNIT) tablet Take 5,000 Units by mouth daily. (Patient not taking: Reported on 07/22/2020)     diclofenac Sodium (VOLTAREN) 1 % GEL Apply 4 g topically 4 (four) times daily. (Patient taking differently: Apply 4 g topically as needed.) 100 g 3   Multiple Vitamins-Minerals (MULTIVITAMIN WITH MINERALS) tablet Take 1 tablet by mouth daily. (Patient not taking: Reported on 07/22/2020)     vitamin B-12 (CYANOCOBALAMIN) 1000 MCG tablet Take 1,000 mcg by mouth daily. (Patient not taking: Reported on 07/22/2020)     No current facility-administered medications on file prior to visit.   Health Maintenance:   Immunization History  Administered Date(s)  Administered   PFIZER(Purple Top)SARS-COV-2 Vaccination 03/03/2019, 03/24/2019   Tdap 12/03/2011   Tetanus: 2013, due next year Pneumovax: N/A Prevnar 58 due age 26 Flu vaccine:2018 Zostavax: due age 56-60. Covid:  03/03/2019, 03/24/2019 , 09/2019  Patient's last menstrual period was 06/26/2020. Pap: 2019 PAP negative Dr. Mitzi Combs , Due and pt will schedule MGM: 07/08/2019 negative- mom with stage 3 cancer at age 42, and had ovarian cancer, mom with 2 sister and GM without cancer. Cat C. Pt will call and schedule next mammogram Mom tested negative.   DEXA: N/A Colonoscopy: N/A EGD: N/A Last Dental Exam: Dr. Catha Combs Last Eye Exam: Dr. 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 FAMILY HISTORY Her family history includes Alcohol abuse in her father; Breast cancer (age of onset: 49) in her mother; Cancer (age of onset: 57) in her mother; Diabetes in her father; Heart disease in her father; Hyperlipidemia in her father; Hypertension in her father. SOCIAL HISTORY She  reports that she has never smoked. She has never used smokeless tobacco. She reports current alcohol use. She reports that she does not use drugs.  Review of Systems  Constitutional:  Negative for chills, fever, malaise/fatigue and weight loss.  HENT:  Negative for congestion, ear discharge, hearing loss, sore throat and tinnitus.   Eyes:  Negative for blurred vision, discharge  and redness.  Respiratory:  Negative for cough, shortness of breath and wheezing.   Cardiovascular:  Negative for chest pain, palpitations, orthopnea and leg swelling.  Gastrointestinal:  Negative for abdominal pain, blood in stool, constipation, diarrhea, heartburn, nausea and vomiting.  Genitourinary:  Negative for dysuria, frequency and urgency.  Musculoskeletal:  Positive for joint pain (RIGHT KNEE PAIN, ACL). Negative for back pain, falls and myalgias.  Skin:  Negative for rash.  Neurological:  Negative for dizziness, tingling, seizures, weakness and headaches.  Combs/Heme/Allergies:  Does not bruise/bleed easily.  Psychiatric/Behavioral:  Negative for depression, hallucinations, memory loss and suicidal ideas. The patient does not have insomnia.      Physical Exam: Estimated body mass index is 24.64 kg/m as calculated from the following:  Height as of this encounter: 5' 6.5" (1.689 m).   Weight as of this encounter: 155 lb (70.3 kg). BP 124/70   Pulse (!) 56   Temp 97.7 F (36.5 C)   Ht 5' 6.5" (1.689 m)   Wt 155 lb (70.3 kg)   LMP 06/26/2020   SpO2 99%   BMI 24.64 kg/m  General Appearance: Well nourished, in no apparent distress.  Eyes: PERRLA, EOMs, conjunctiva no swelling or erythema, normal fundi and vessels.  Sinuses: No Frontal/maxillary tenderness  ENT/Mouth: Ext aud canals clear, normal light reflex with TMs without erythema, bulging. Good dentition. no swelling, or exudate on post pharynx. Tonsils not swollen or erythematous. Hearing normal.  Neck: Supple, thyroid nodules appreciated. No bruits  Respiratory: Respiratory effort normal, BS equal bilaterally without rales, rhonchi, wheezing or stridor.  Cardio: RRR without murmurs, rubs or gallops. Brisk peripheral pulses without edema.  Chest: symmetric, with normal excursions and percussion.  Breasts: defer Dr. Juliene Pina Abdomen: Soft, , flat, no guarding, rebound, hernias, masses, or organomegaly. .  Lymphatics: Non  tender without lymphadenopathy.  Genitourinary: .defer Dr. Mitzi Combs Musculoskeletal: Full ROM all peripheral extremities,5/5 strength, and normal gait. Skin: Warm, dry without rashes, lesions, ecchymosis. Neuro: Cranial nerves intact, reflexes equal bilaterally. Normal muscle tone, no cerebellar symptoms. Psych: Awake and oriented X 3, normal affect, Insight and Judgment appropriate.   EKG: Normal     Demarques Pilz W Yamilette Garretson 3:39 PM [facility]

## 2020-07-22 ENCOUNTER — Ambulatory Visit (INDEPENDENT_AMBULATORY_CARE_PROVIDER_SITE_OTHER): Payer: 59 | Admitting: Nurse Practitioner

## 2020-07-22 ENCOUNTER — Other Ambulatory Visit: Payer: Self-pay

## 2020-07-22 ENCOUNTER — Encounter: Payer: Self-pay | Admitting: Nurse Practitioner

## 2020-07-22 VITALS — BP 124/70 | HR 56 | Temp 97.7°F | Ht 66.5 in | Wt 155.0 lb

## 2020-07-22 DIAGNOSIS — F988 Other specified behavioral and emotional disorders with onset usually occurring in childhood and adolescence: Secondary | ICD-10-CM

## 2020-07-22 DIAGNOSIS — Z1329 Encounter for screening for other suspected endocrine disorder: Secondary | ICD-10-CM

## 2020-07-22 DIAGNOSIS — I10 Essential (primary) hypertension: Secondary | ICD-10-CM | POA: Diagnosis not present

## 2020-07-22 DIAGNOSIS — Z0000 Encounter for general adult medical examination without abnormal findings: Secondary | ICD-10-CM

## 2020-07-22 DIAGNOSIS — Z79899 Other long term (current) drug therapy: Secondary | ICD-10-CM

## 2020-07-22 DIAGNOSIS — Z136 Encounter for screening for cardiovascular disorders: Secondary | ICD-10-CM | POA: Diagnosis not present

## 2020-07-22 DIAGNOSIS — Z131 Encounter for screening for diabetes mellitus: Secondary | ICD-10-CM

## 2020-07-22 DIAGNOSIS — Z130 Encounter for screening for diseases of the blood and blood-forming organs and certain disorders involving the immune mechanism: Secondary | ICD-10-CM

## 2020-07-22 DIAGNOSIS — E785 Hyperlipidemia, unspecified: Secondary | ICD-10-CM

## 2020-07-22 DIAGNOSIS — Z0001 Encounter for general adult medical examination with abnormal findings: Secondary | ICD-10-CM

## 2020-07-22 DIAGNOSIS — Z1389 Encounter for screening for other disorder: Secondary | ICD-10-CM

## 2020-07-22 DIAGNOSIS — F419 Anxiety disorder, unspecified: Secondary | ICD-10-CM

## 2020-07-22 DIAGNOSIS — E559 Vitamin D deficiency, unspecified: Secondary | ICD-10-CM

## 2020-07-22 NOTE — Patient Instructions (Signed)

## 2020-07-22 NOTE — Addendum Note (Signed)
Addended by: Revonda Humphrey on: 07/22/2020 03:58 PM   Modules accepted: Orders

## 2020-07-23 LAB — CBC WITH DIFFERENTIAL/PLATELET
Absolute Monocytes: 522 cells/uL (ref 200–950)
Basophils Absolute: 30 cells/uL (ref 0–200)
Basophils Relative: 0.5 %
Eosinophils Absolute: 72 cells/uL (ref 15–500)
Eosinophils Relative: 1.2 %
HCT: 39.3 % (ref 35.0–45.0)
Hemoglobin: 12.9 g/dL (ref 11.7–15.5)
Lymphs Abs: 1878 cells/uL (ref 850–3900)
MCH: 31 pg (ref 27.0–33.0)
MCHC: 32.8 g/dL (ref 32.0–36.0)
MCV: 94.5 fL (ref 80.0–100.0)
MPV: 11.1 fL (ref 7.5–12.5)
Monocytes Relative: 8.7 %
Neutro Abs: 3498 cells/uL (ref 1500–7800)
Neutrophils Relative %: 58.3 %
Platelets: 206 10*3/uL (ref 140–400)
RBC: 4.16 10*6/uL (ref 3.80–5.10)
RDW: 13.1 % (ref 11.0–15.0)
Total Lymphocyte: 31.3 %
WBC: 6 10*3/uL (ref 3.8–10.8)

## 2020-07-23 LAB — COMPLETE METABOLIC PANEL WITH GFR
AG Ratio: 1.8 (calc) (ref 1.0–2.5)
ALT: 11 U/L (ref 6–29)
AST: 15 U/L (ref 10–30)
Albumin: 4.4 g/dL (ref 3.6–5.1)
Alkaline phosphatase (APISO): 38 U/L (ref 31–125)
BUN/Creatinine Ratio: 15 (calc) (ref 6–22)
BUN: 15 mg/dL (ref 7–25)
CO2: 26 mmol/L (ref 20–32)
Calcium: 9.4 mg/dL (ref 8.6–10.2)
Chloride: 104 mmol/L (ref 98–110)
Creat: 1.02 mg/dL — ABNORMAL HIGH (ref 0.50–0.99)
Globulin: 2.5 g/dL (calc) (ref 1.9–3.7)
Glucose, Bld: 79 mg/dL (ref 65–99)
Potassium: 4.1 mmol/L (ref 3.5–5.3)
Sodium: 137 mmol/L (ref 135–146)
Total Bilirubin: 0.8 mg/dL (ref 0.2–1.2)
Total Protein: 6.9 g/dL (ref 6.1–8.1)
eGFR: 71 mL/min/{1.73_m2} (ref >=60)

## 2020-07-23 LAB — LIPID PANEL
Cholesterol: 247 mg/dL — ABNORMAL HIGH (ref <200)
HDL: 90 mg/dL (ref >=50)
LDL Cholesterol (Calc): 140 mg/dL (calc) — ABNORMAL HIGH
Non-HDL Cholesterol (Calc): 157 mg/dL (calc) — ABNORMAL HIGH (ref <130)
Total CHOL/HDL Ratio: 2.7 (calc) (ref <5.0)
Triglycerides: 75 mg/dL (ref <150)

## 2020-07-23 LAB — HEMOGLOBIN A1C
Hgb A1c MFr Bld: 4.8 % of total Hgb (ref <5.7)
Mean Plasma Glucose: 91 mg/dL
eAG (mmol/L): 5 mmol/L

## 2020-07-23 LAB — TSH: TSH: 3.12 mIU/L

## 2020-07-23 LAB — MAGNESIUM: Magnesium: 2 mg/dL (ref 1.5–2.5)

## 2020-10-05 ENCOUNTER — Other Ambulatory Visit: Payer: Self-pay | Admitting: Internal Medicine

## 2020-10-05 DIAGNOSIS — Z1231 Encounter for screening mammogram for malignant neoplasm of breast: Secondary | ICD-10-CM

## 2020-10-07 ENCOUNTER — Other Ambulatory Visit: Payer: Self-pay

## 2020-10-07 ENCOUNTER — Ambulatory Visit
Admission: RE | Admit: 2020-10-07 | Discharge: 2020-10-07 | Disposition: A | Discharge status: Home / Self Care | Payer: 59 | Source: Ambulatory Visit | Attending: Internal Medicine | Admitting: Internal Medicine

## 2020-10-07 DIAGNOSIS — Z1231 Encounter for screening mammogram for malignant neoplasm of breast: Secondary | ICD-10-CM

## 2020-10-15 ENCOUNTER — Encounter: Payer: 59 | Admitting: Adult Health

## 2021-01-26 ENCOUNTER — Ambulatory Visit: Payer: BC Managed Care – PPO | Admitting: Physician Assistant

## 2021-07-04 ENCOUNTER — Encounter: Payer: 59 | Admitting: Adult Health

## 2021-07-21 NOTE — Progress Notes (Deleted)
Complete Physical  Assessment and Plan:  Routine general medical examination at a health care facility 1 year  Screening for hematuria or proteinuria -     Urinalysis, Routine w reflex microscopic -     Microalbumin / creatinine urine ratio  Chronic pain of left knee -  Pt is having ACL surgery on 08/09/20       - avoiding oral NSAIDS due to history of abnormal kidney functinon       - Has stopped supplements as requested by ortho surgeon due to upcoming surgery  B12 deficiency -     Vitamin B12, currently stopped for upcoming surgery  Attention deficit disorder, unspecified hyperactivity presence Given 10 mg adderall to take as needed Get on B12 supplement  Anxiety -     TSH  Hyperlipidemia, unspecified hyperlipidemia type -     TSH -     Lipid panel check lipids decrease fatty foods increase activity.   Vitamin D deficiency -    Vitamin D declined today- not covered by insurance  Screening for blood or protein in urine -     Urinalysis, Routine w reflex microscopic -     Microalbumin / creatinine urine ratio  Dense breast tissue       - 07/08/19 mammogram negative, due  needs to schedule  Medication management -     CBC with Differential/Platelet -     CMP -     Magnesium      Discussed med's effects and SE's. Screening labs and tests as requested with regular follow-up as recommended.  HPI  41 y.o. female  presents for a complete physical.  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  She is doing ok without medication for add currently, side effects She is vegetarian and did not start B12 as suggested last year.  Lab Results  Component Value Date   [name] 369 06/24/2019    She is married,  has a 85 year old daughter [name] and a 75 year old son.    Her blood pressure has been controlled at home, today their BP is   She does workout. She denies chest pain,  shortness of breath, dizziness.  She has had abnormal kidney function in the past but has had normal labs, including ANA, antiDNA, complements, lyme, ferritin, iron, sed rate.  Lab Results  Component Value Date   CREATININE 1.02 (H) 07/22/2020   BUN 15 07/22/2020   NA 137 07/22/2020   K 4.1 07/22/2020   CL 104 07/22/2020   CO2 26 07/22/2020   She is not on cholesterol medication and denies myalgias. Her cholesterol is at goal. The cholesterol last visit was:   Lab Results  Component Value Date   CHOL 247 (H) 07/22/2020   HDL 90 07/22/2020   LDLCALC 140 (H) 07/22/2020   TRIG 75 07/22/2020   CHOLHDL 2.7 07/22/2020   Follows with Dr. Jorja Loa for abnormal nevus q 6 months.  [name]   Lab Results  Component Value Date   VD25OH 33 06/24/2019     She had ACL repair Sept 2019- training for iron man and following with PT.   Current Medications:  Current Outpatient Medications on File Prior to Visit  Medication Sig Dispense Refill   amphetamine-dextroamphetamine (ADDERALL) 10 MG tablet 1 pill as needed up to once a day for  ADD (Patient not taking: Reported on 07/22/2020) 30 tablet 0   cholecalciferol (VITAMIN D3) 25 MCG (1000 UNIT) tablet Take 5,000 Units by mouth daily. (Patient not taking: Reported on 07/22/2020)     Multiple Vitamins-Minerals (MULTIVITAMIN WITH MINERALS) tablet Take 1 tablet by mouth daily. (Patient not taking: Reported on 07/22/2020)     vitamin B-12 (CYANOCOBALAMIN) 1000 MCG tablet Take 1,000 mcg by mouth daily. (Patient not taking: Reported on 07/22/2020)     No current facility-administered medications on file prior to visit.   Health Maintenance:   Immunization History  Administered Date(s) Administered   PFIZER(Purple Top)SARS-COV-2 Vaccination 03/03/2019, 03/24/2019   Tdap 12/03/2011   Tetanus: 2013, due next year Pneumovax: N/A Prevnar 68 due age 48 Flu vaccine:2018 Zostavax: due age 57-60. Covid:  03/03/2019, 03/24/2019 ,  09/2019  No LMP recorded. Pap: 2019 PAP negative Dr. Mitzi Hansen , Due and pt will schedule MGM: 07/08/2019 negative- mom with stage 3 cancer at age 59, and had ovarian cancer, mom with 2 sister and GM without cancer. Cat C. Pt will call and schedule next mammogram Mom tested negative.   DEXA: N/A Colonoscopy: N/A EGD: N/A Last Dental Exam: Dr. Catha Brow Last Eye Exam: Dr. 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 FAMILY HISTORY Her family history includes Alcohol abuse in her father; Breast cancer (age of onset: 28) in her mother; Cancer (age of onset: 44) in her mother; Diabetes in her father; Heart disease in her father; Hyperlipidemia in her father; Hypertension in her father. SOCIAL HISTORY She  reports that she has never smoked. She has never used smokeless tobacco. She reports current alcohol use. She reports that she does not use drugs.  Review of Systems  Constitutional:  Negative for chills and fever.  HENT:  Negative for congestion, hearing loss, sinus pain, sore throat and tinnitus.   Eyes:  Negative for blurred vision and double vision.  Respiratory:  Negative for cough, hemoptysis, sputum production, shortness of breath and wheezing.   Cardiovascular:  Negative for chest pain, palpitations and leg swelling.  Gastrointestinal:  Negative for abdominal pain, constipation, diarrhea, heartburn, nausea and vomiting.  Genitourinary:   Negative for dysuria and urgency.  Musculoskeletal:  Negative for back pain, falls, joint pain, myalgias and neck pain.  Skin:  Negative for rash.  Neurological:  Negative for dizziness, tingling, tremors, weakness and headaches.  Endo/Heme/Allergies:  Does not bruise/bleed easily.  Psychiatric/Behavioral:  Negative for depression and suicidal ideas. The patient is not nervous/anxious and does not have insomnia.       Physical Exam: Estimated body mass index is 24.64 kg/m as calculated from the following:   Height as of 07/22/20: 5' 6.5" (1.689 m).   Weight as of 07/22/20: 155 lb (70.3 kg). There were no vitals taken for this visit. General Appearance: Well nourished, in no apparent distress.  Eyes: PERRLA, EOMs, conjunctiva no swelling or erythema, normal fundi and vessels.  Sinuses: No Frontal/maxillary tenderness  ENT/Mouth: Ext aud canals clear, normal light reflex with TMs without erythema, bulging. Good dentition. no swelling, or exudate on post pharynx. Tonsils not swollen or erythematous. Hearing normal.  Neck: Supple, thyroid nodules appreciated. No bruits  Respiratory: Respiratory effort normal, BS equal bilaterally without rales, rhonchi, wheezing or stridor.  Cardio: RRR without murmurs, rubs or gallops. Brisk peripheral pulses without edema.  Chest: symmetric, with normal excursions and percussion.  Breasts: defer Dr. Benjie Karvonen Abdomen: Soft, , flat, no guarding, rebound, hernias, masses, or organomegaly. .  Lymphatics: Non tender without lymphadenopathy.  Genitourinary: .defer Dr. Lisbeth Renshaw Musculoskeletal: Full ROM all peripheral extremities,5/5 strength, and normal gait. Skin: Warm, dry without rashes, lesions, ecchymosis. Neuro: Cranial nerves intact, reflexes equal bilaterally. Normal muscle tone, no cerebellar symptoms. Psych: Awake and oriented X 3, normal affect, Insight and Judgment appropriate.   EKG: Normal     Jett Fukuda E Carlye Grippe 2:12 PM [facility]  Internal Medicine

## 2021-07-25 ENCOUNTER — Encounter: Payer: 59 | Admitting: Nurse Practitioner

## 2021-07-25 DIAGNOSIS — N182 Chronic kidney disease, stage 2 (mild): Secondary | ICD-10-CM

## 2021-07-25 DIAGNOSIS — Z136 Encounter for screening for cardiovascular disorders: Secondary | ICD-10-CM

## 2021-07-25 DIAGNOSIS — Z1389 Encounter for screening for other disorder: Secondary | ICD-10-CM

## 2021-07-25 DIAGNOSIS — R922 Inconclusive mammogram: Secondary | ICD-10-CM

## 2021-07-25 DIAGNOSIS — Z1329 Encounter for screening for other suspected endocrine disorder: Secondary | ICD-10-CM

## 2021-07-25 DIAGNOSIS — Z79899 Other long term (current) drug therapy: Secondary | ICD-10-CM

## 2021-07-25 DIAGNOSIS — F988 Other specified behavioral and emotional disorders with onset usually occurring in childhood and adolescence: Secondary | ICD-10-CM

## 2021-07-25 DIAGNOSIS — E785 Hyperlipidemia, unspecified: Secondary | ICD-10-CM

## 2021-07-25 DIAGNOSIS — Z131 Encounter for screening for diabetes mellitus: Secondary | ICD-10-CM

## 2021-07-25 DIAGNOSIS — E559 Vitamin D deficiency, unspecified: Secondary | ICD-10-CM

## 2021-07-25 DIAGNOSIS — F419 Anxiety disorder, unspecified: Secondary | ICD-10-CM

## 2021-07-25 DIAGNOSIS — Z0001 Encounter for general adult medical examination with abnormal findings: Secondary | ICD-10-CM

## 2021-08-10 NOTE — Progress Notes (Unsigned)
Complete Physical  Assessment and Plan:  Routine general medical examination at a health care facility 1 year  Screening for hematuria or proteinuria -     Urinalysis, Routine w reflex microscopic -     Microalbumin / creatinine urine ratio  Chronic pain of left knee -  Pt is having ACL surgery on 08/09/20       - avoiding oral NSAIDS due to history of abnormal kidney functinon       - Has stopped supplements as requested by ortho surgeon due to upcoming surgery  B12 deficiency -     Vitamin B12, currently stopped for upcoming surgery  Attention deficit disorder, unspecified hyperactivity presence Given 10 mg adderall to take as needed Get on B12 supplement  Anxiety -     TSH  Hyperlipidemia, unspecified hyperlipidemia type -     TSH -     Lipid panel check lipids decrease fatty foods increase activity.   Vitamin D deficiency -    Vitamin D declined today- not covered by insurance  Screening for blood or protein in urine -     Urinalysis, Routine w reflex microscopic -     Microalbumin / creatinine urine ratio  Dense breast tissue       - 07/08/19 mammogram negative, due  needs to schedule  Medication management -     CBC with Differential/Platelet -     CMP -     Magnesium      Discussed med's effects and SE's. Screening labs and tests as requested with regular follow-up as recommended.  HPI  41 y.o. female  presents for a complete physical.  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  She is doing ok without medication for add currently, side effects She is vegetarian and did not start B12 as suggested last year.  Lab Results  Component Value Date   [name] 369 06/24/2019    She is married,  has a 52 year old daughter Jasmine Combs and a 25 year old son.    Her blood pressure has been controlled at home, today their BP is   She does workout. She denies chest pain,  shortness of breath, dizziness.  She has had abnormal kidney function in the past but has had normal labs, including ANA, antiDNA, complements, lyme, ferritin, iron, sed rate.  Lab Results  Component Value Date   CREATININE 1.02 (H) 07/22/2020   BUN 15 07/22/2020   NA 137 07/22/2020   K 4.1 07/22/2020   CL 104 07/22/2020   CO2 26 07/22/2020   She is not on cholesterol medication and denies myalgias. Her cholesterol is at goal. The cholesterol last visit was:   Lab Results  Component Value Date   CHOL 247 (H) 07/22/2020   HDL 90 07/22/2020   LDLCALC 140 (H) 07/22/2020   TRIG 75 07/22/2020   CHOLHDL 2.7 07/22/2020   Follows with Dr. Jorja Combs for abnormal nevus q 6 months.  [name]   Lab Results  Component Value Date   VD25OH 33 06/24/2019     She had ACL repair Sept 2019- training for iron man and following with PT.   Current Medications:  Current Outpatient Medications on File Prior to Visit  Medication Sig Dispense Refill   amphetamine-dextroamphetamine (ADDERALL) 10 MG tablet 1 pill as needed up to once a day for  ADD (Patient not taking: Reported on 07/22/2020) 30 tablet 0   cholecalciferol (VITAMIN D3) 25 MCG (1000 UNIT) tablet Take 5,000 Units by mouth daily. (Patient not taking: Reported on 07/22/2020)     Multiple Vitamins-Minerals (MULTIVITAMIN WITH MINERALS) tablet Take 1 tablet by mouth daily. (Patient not taking: Reported on 07/22/2020)     vitamin B-12 (CYANOCOBALAMIN) 1000 MCG tablet Take 1,000 mcg by mouth daily. (Patient not taking: Reported on 07/22/2020)     No current facility-administered medications on file prior to visit.   Health Maintenance:   Immunization History  Administered Date(s) Administered   PFIZER(Purple Top)SARS-COV-2 Vaccination 03/03/2019, 03/24/2019   Tdap 12/03/2011   Tetanus: 2013, due next year Pneumovax: N/A Prevnar 53 due age 15 Flu vaccine:2018 Zostavax: due age 30-60. Covid:  03/03/2019, 03/24/2019 ,  09/2019  No LMP recorded. Pap: 2019 PAP negative Dr. Mitzi Combs , Due and pt will schedule MGM: 07/08/2019 negative- mom with stage 3 cancer at age 110, and had ovarian cancer, mom with 2 sister and GM without cancer. Cat C. Pt will call and schedule next mammogram Mom tested negative.   DEXA: N/A Colonoscopy: N/A EGD: N/A Last Dental Exam: Dr. Catha Combs Last Eye Exam: Dr. 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 FAMILY HISTORY Her family history includes Alcohol abuse in her father; Breast cancer (age of onset: 8) in her mother; Cancer (age of onset: 73) in her mother; Diabetes in her father; Heart disease in her father; Hyperlipidemia in her father; Hypertension in her father. SOCIAL HISTORY She  reports that she has never smoked. She has never used smokeless tobacco. She reports current alcohol use. She reports that she does not use drugs.  Review of Systems  Constitutional:  Negative for chills and fever.  HENT:  Negative for congestion, hearing loss, sinus pain, sore throat and tinnitus.   Eyes:  Negative for blurred vision and double vision.  Respiratory:  Negative for cough, hemoptysis, sputum production, shortness of breath and wheezing.   Cardiovascular:  Negative for chest pain, palpitations and leg swelling.  Gastrointestinal:  Negative for abdominal pain, constipation, diarrhea, heartburn, nausea and vomiting.  Genitourinary:   Negative for dysuria and urgency.  Musculoskeletal:  Negative for back pain, falls, joint pain, myalgias and neck pain.  Skin:  Negative for rash.  Neurological:  Negative for dizziness, tingling, tremors, weakness and headaches.  Combs/Heme/Allergies:  Does not bruise/bleed easily.  Psychiatric/Behavioral:  Negative for depression and suicidal ideas. The patient is not nervous/anxious and does not have insomnia.       Physical Exam: Estimated body mass index is 24.64 kg/m as calculated from the following:   Height as of 07/22/20: 5' 6.5" (1.689 m).   Weight as of 07/22/20: 155 lb (70.3 kg). There were no vitals taken for this visit. General Appearance: Well nourished, in no apparent distress.  Eyes: PERRLA, EOMs, conjunctiva no swelling or erythema, normal fundi and vessels.  Sinuses: No Frontal/maxillary tenderness  ENT/Mouth: Ext aud canals clear, normal light reflex with TMs without erythema, bulging. Good dentition. no swelling, or exudate on post pharynx. Tonsils not swollen or erythematous. Hearing normal.  Neck: Supple, thyroid nodules appreciated. No bruits  Respiratory: Respiratory effort normal, BS equal bilaterally without rales, rhonchi, wheezing or stridor.  Cardio: RRR without murmurs, rubs or gallops. Brisk peripheral pulses without edema.  Chest: symmetric, with normal excursions and percussion.  Breasts: defer Dr. Benjie Karvonen Abdomen: Soft, , flat, no guarding, rebound, hernias, masses, or organomegaly. .  Lymphatics: Non tender without lymphadenopathy.  Genitourinary: .defer Dr. Lisbeth Renshaw Musculoskeletal: Full ROM all peripheral extremities,5/5 strength, and normal gait. Skin: Warm, dry without rashes, lesions, ecchymosis. Neuro: Cranial nerves intact, reflexes equal bilaterally. Normal muscle tone, no cerebellar symptoms. Psych: Awake and oriented X 3, normal affect, Insight and Judgment appropriate.   EKG: Normal     Jasmine Combs E  1:40 PM [facility]

## 2021-08-11 ENCOUNTER — Encounter: Payer: Self-pay | Admitting: Nurse Practitioner

## 2021-08-11 ENCOUNTER — Ambulatory Visit (INDEPENDENT_AMBULATORY_CARE_PROVIDER_SITE_OTHER): Payer: 59 | Admitting: Nurse Practitioner

## 2021-08-11 VITALS — BP 104/68 | HR 47 | Temp 97.3°F | Ht 66.5 in | Wt 152.6 lb

## 2021-08-11 DIAGNOSIS — E785 Hyperlipidemia, unspecified: Secondary | ICD-10-CM

## 2021-08-11 DIAGNOSIS — I10 Essential (primary) hypertension: Secondary | ICD-10-CM

## 2021-08-11 DIAGNOSIS — Z136 Encounter for screening for cardiovascular disorders: Secondary | ICD-10-CM | POA: Diagnosis not present

## 2021-08-11 DIAGNOSIS — Z1329 Encounter for screening for other suspected endocrine disorder: Secondary | ICD-10-CM

## 2021-08-11 DIAGNOSIS — Z0001 Encounter for general adult medical examination with abnormal findings: Secondary | ICD-10-CM

## 2021-08-11 DIAGNOSIS — Z79899 Other long term (current) drug therapy: Secondary | ICD-10-CM

## 2021-08-11 DIAGNOSIS — E559 Vitamin D deficiency, unspecified: Secondary | ICD-10-CM

## 2021-08-11 DIAGNOSIS — Z0000 Encounter for general adult medical examination without abnormal findings: Secondary | ICD-10-CM

## 2021-08-11 DIAGNOSIS — E538 Deficiency of other specified B group vitamins: Secondary | ICD-10-CM

## 2021-08-11 DIAGNOSIS — F988 Other specified behavioral and emotional disorders with onset usually occurring in childhood and adolescence: Secondary | ICD-10-CM

## 2021-08-11 DIAGNOSIS — F419 Anxiety disorder, unspecified: Secondary | ICD-10-CM

## 2021-08-11 DIAGNOSIS — Z1389 Encounter for screening for other disorder: Secondary | ICD-10-CM

## 2021-08-11 DIAGNOSIS — Z131 Encounter for screening for diabetes mellitus: Secondary | ICD-10-CM

## 2021-08-12 ENCOUNTER — Other Ambulatory Visit: Payer: Self-pay | Admitting: Nurse Practitioner

## 2021-08-12 DIAGNOSIS — E785 Hyperlipidemia, unspecified: Secondary | ICD-10-CM

## 2021-08-12 LAB — CBC WITH DIFFERENTIAL/PLATELET
Absolute Monocytes: 439 cells/uL (ref 200–950)
Basophils Absolute: 29 cells/uL (ref 0–200)
Basophils Relative: 0.5 %
Eosinophils Absolute: 57 cells/uL (ref 15–500)
Eosinophils Relative: 1 %
HCT: 39.3 % (ref 35.0–45.0)
Hemoglobin: 13.3 g/dL (ref 11.7–15.5)
Lymphs Abs: 2143 cells/uL (ref 850–3900)
MCH: 31.9 pg (ref 27.0–33.0)
MCHC: 33.8 g/dL (ref 32.0–36.0)
MCV: 94.2 fL (ref 80.0–100.0)
MPV: 10.5 fL (ref 7.5–12.5)
Monocytes Relative: 7.7 %
Neutro Abs: 3032 cells/uL (ref 1500–7800)
Neutrophils Relative %: 53.2 %
Platelets: 225 10*3/uL (ref 140–400)
RBC: 4.17 10*6/uL (ref 3.80–5.10)
RDW: 12.5 % (ref 11.0–15.0)
Total Lymphocyte: 37.6 %
WBC: 5.7 10*3/uL (ref 3.8–10.8)

## 2021-08-12 LAB — MAGNESIUM: Magnesium: 2.3 mg/dL (ref 1.5–2.5)

## 2021-08-12 LAB — URINALYSIS, ROUTINE W REFLEX MICROSCOPIC
Bilirubin Urine: NEGATIVE
Glucose, UA: NEGATIVE
Hgb urine dipstick: NEGATIVE
Ketones, ur: NEGATIVE
Leukocytes,Ua: NEGATIVE
Nitrite: NEGATIVE
Protein, ur: NEGATIVE
Specific Gravity, Urine: 1.004 (ref 1.001–1.035)
pH: 7 (ref 5.0–8.0)

## 2021-08-12 LAB — LIPID PANEL
Cholesterol: 234 mg/dL — ABNORMAL HIGH (ref <200)
HDL: 94 mg/dL (ref >=50)
LDL Cholesterol (Calc): 124 mg/dL (calc) — ABNORMAL HIGH
Non-HDL Cholesterol (Calc): 140 mg/dL (calc) — ABNORMAL HIGH (ref <130)
Total CHOL/HDL Ratio: 2.5 (calc) (ref <5.0)
Triglycerides: 65 mg/dL (ref <150)

## 2021-08-12 LAB — COMPLETE METABOLIC PANEL WITH GFR
AG Ratio: 2 (calc) (ref 1.0–2.5)
ALT: 17 U/L (ref 6–29)
AST: 18 U/L (ref 10–30)
Albumin: 4.7 g/dL (ref 3.6–5.1)
Alkaline phosphatase (APISO): 38 U/L (ref 31–125)
BUN/Creatinine Ratio: 11 (calc) (ref 6–22)
BUN: 12 mg/dL (ref 7–25)
CO2: 25 mmol/L (ref 20–32)
Calcium: 9.4 mg/dL (ref 8.6–10.2)
Chloride: 104 mmol/L (ref 98–110)
Creat: 1.09 mg/dL — ABNORMAL HIGH (ref 0.50–0.99)
Globulin: 2.4 g/dL (calc) (ref 1.9–3.7)
Glucose, Bld: 76 mg/dL (ref 65–99)
Potassium: 4.4 mmol/L (ref 3.5–5.3)
Sodium: 139 mmol/L (ref 135–146)
Total Bilirubin: 1 mg/dL (ref 0.2–1.2)
Total Protein: 7.1 g/dL (ref 6.1–8.1)
eGFR: 65 mL/min/{1.73_m2} (ref >=60)

## 2021-08-12 LAB — VITAMIN D 25 HYDROXY (VIT D DEFICIENCY, FRACTURES): Vit D, 25-Hydroxy: 41 ng/mL (ref 30–100)

## 2021-08-12 LAB — HEMOGLOBIN A1C
Hgb A1c MFr Bld: 4.8 % of total Hgb (ref <5.7)
Mean Plasma Glucose: 91 mg/dL
eAG (mmol/L): 5 mmol/L

## 2021-08-12 LAB — MICROALBUMIN / CREATININE URINE RATIO
Creatinine, Urine: 24 mg/dL (ref 20–275)
Microalb, Ur: <0.2 mg/dL

## 2021-08-12 LAB — TSH: TSH: 2.09 mIU/L

## 2021-08-12 LAB — VITAMIN B12: Vitamin B-12: 490 pg/mL (ref 200–1100)

## 2021-08-12 MED ORDER — ROSUVASTATIN CALCIUM 5 MG PO TABS: 5.0000 mg | ORAL_TABLET | Freq: Every day | ORAL | 11 refills | Status: AC

## 2022-03-22 ENCOUNTER — Other Ambulatory Visit: Payer: Self-pay | Admitting: Internal Medicine

## 2022-03-22 DIAGNOSIS — Z1231 Encounter for screening mammogram for malignant neoplasm of breast: Secondary | ICD-10-CM

## 2022-03-27 ENCOUNTER — Ambulatory Visit
Admission: RE | Admit: 2022-03-27 | Discharge: 2022-03-27 | Disposition: A | Discharge status: Home / Self Care | Payer: 59 | Source: Ambulatory Visit | Attending: Internal Medicine | Admitting: Internal Medicine

## 2022-03-27 DIAGNOSIS — Z1231 Encounter for screening mammogram for malignant neoplasm of breast: Secondary | ICD-10-CM

## 2022-07-26 ENCOUNTER — Encounter: Payer: 59 | Admitting: Nurse Practitioner

## 2022-08-15 ENCOUNTER — Encounter: Payer: 59 | Admitting: Nurse Practitioner

## 2022-10-31 ENCOUNTER — Encounter: Payer: 59 | Admitting: Nurse Practitioner

## 2022-11-08 ENCOUNTER — Encounter: Payer: 59 | Admitting: Nurse Practitioner

## 2022-12-19 NOTE — Progress Notes (Unsigned)
Complete Physical  Assessment and Plan:  Routine general medical examination at a health care facility 1 year  Screening for hematuria or proteinuria -     Urinalysis, Routine w reflex microscopic -     Microalbumin / creatinine urine ratio  B12 deficiency -     Vitamin B12, chas not been good about taking supplement  Attention deficit disorder, unspecified hyperactivity presence Given 10 mg adderall to take as needed Currently well controlled without medication  Anxiety Continue diet and exercise, practice good sleep hygiene -     TSH  Hyperlipidemia, unspecified hyperlipidemia type -     TSH -     Lipid panel She is vegetarian, 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 Very fit.  Have discussed if LDL remains elevated will plan low dose of Rosuvastatin decrease fatty foods increase activity.   Vitamin D deficiency -    Vitamin D declined today- not covered by insurance  Dense breast tissue       - 07/08/19 mammogram negative, due  needs to schedule  Medication management -     CBC with Differential/Platelet -     CMP -     Magnesium  Screening for ischemic heart disease - EKG  Screening for thyroid disorder - TSH  Screening for diabetes mellitus - A1c  Discussed med's effects and SE's. Screening labs and tests as requested with regular follow-up as recommended.  HPI  42 y.o. female  presents for a complete physical.  BMI is There is no height or weight on file to calculate BMI., she has been working on diet and exercise. She continues to be a vegetarian and is competing with iron man Wt Readings from Last 3 Encounters:  08/11/21 152 lb 9.6 oz (69.2 kg)  07/22/20 155 lb (70.3 kg)  12/26/19 152 lb (68.9 kg)    She is vegetarian and did not start B12 as suggested last year.  Lab Results  Component Value Date   [name] 490 08/11/2021   She is married,  has a  67 year old daughter Jasmine Combs and a 45 year old son.    Her blood pressure has been controlled at home, today their BP is     BP Readings from Last 3 Encounters:  08/11/21 104/68  07/22/20 124/70  12/26/19 106/68  She does workout. She denies chest pain, shortness of breath, dizziness.    She has had abnormal kidney function in the past but has had normal labs, including ANA, antiDNA, complements, lyme, ferritin, iron, sed rate.  Lab Results  Component Value Date   EGFR 65 08/11/2021    She is not on cholesterol medication and denies myalgias. Her cholesterol is at goal. The cholesterol last visit was:   Lab Results  Component Value Date   CHOL 234 (H) 08/11/2021   HDL 94 08/11/2021   LDLCALC 124 (H) 08/11/2021   TRIG 65 08/11/2021   CHOLHDL 2.5 08/11/2021   [facility] abnormal nevus q 6 months.   [name]   Lab Results  Component Value Date   VD25OH 41 08/11/2021    Current Medications:  Current Outpatient Medications on File Prior to Visit  Medication Sig Dispense Refill   cholecalciferol (VITAMIN D3) 25 MCG (1000 UNIT) tablet Take 5,000 Units by mouth daily. (Patient not taking: Reported on 07/22/2020)     Multiple Vitamins-Minerals (MULTIVITAMIN WITH MINERALS) tablet Take 1 tablet by mouth daily.     rosuvastatin (CRESTOR) 5 MG tablet Take  1 tablet (5 mg total) by mouth daily. 30 tablet 11   vitamin B-12 (CYANOCOBALAMIN) 1000 MCG tablet Take 1,000 mcg by mouth daily. (Patient not taking: Reported on 07/22/2020)     No current facility-administered medications on file prior to visit.   Health Maintenance:   Immunization History  Administered Date(s) Administered   PFIZER(Purple Top)SARS-COV-2 Vaccination 03/03/2019, 03/24/2019   Tdap 12/03/2011   Tetanus: 2013, due next year Pneumovax: N/A Prevnar 54 due age 24 Flu vaccine:2018 Zostavax: due age 25-60. Covid:  03/03/2019, 03/24/2019 , 09/2019  No LMP recorded. Pap: 2019 PAP negative Dr. Mitzi Hansen , Due and pt will schedule MGM: 07/08/2019 negative- mom with stage 3 cancer at age  62, and had ovarian cancer, mom with 2 sister and GM without cancer. Cat C. Pt will call and schedule next mammogram Mom tested negative.   DEXA: N/A Colonoscopy: N/A EGD: N/A Last Dental Exam: Dr. Catha Brow Last Eye Exam: Dr. 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 FAMILY HISTORY Her family history includes Alcohol abuse in her father; Breast cancer (age of onset: 65) in her mother; Cancer (age of onset: 52) in her mother; Diabetes in her father; Heart disease in her father; Hyperlipidemia in her father; Hypertension in her father. SOCIAL HISTORY She  reports that she has never smoked. She has never used smokeless tobacco. She reports current alcohol use. She reports that she does not use drugs.  Review of Systems  Constitutional:  Negative for chills and fever.  HENT:  Negative for congestion, hearing loss, sinus pain, sore throat and tinnitus.   Eyes:  Negative for blurred vision and double vision.  Respiratory:  Negative for cough, hemoptysis, sputum production, shortness of breath and wheezing.   Cardiovascular:  Negative for chest pain, palpitations and leg swelling.  Gastrointestinal:  Negative for abdominal pain, constipation, diarrhea, heartburn, nausea and vomiting.  Genitourinary:  Negative for dysuria and urgency.  Musculoskeletal:  Negative for back pain, falls, joint pain, myalgias and neck pain.  Skin:  Negative for rash.   Neurological:  Negative for dizziness, tingling, tremors, weakness and headaches.  Endo/Heme/Allergies:  Does not bruise/bleed easily.  Psychiatric/Behavioral:  Negative for depression and suicidal ideas. The patient is not nervous/anxious and does not have insomnia.       Physical Exam: Estimated body mass index is 24.26 kg/m as calculated from the following:   Height as of 08/11/21: 5' 6.5" (1.689 m).   Weight as of 08/11/21: 152 lb 9.6 oz (69.2 kg). There were no vitals taken for this visit. General Appearance: Well nourished, in no apparent distress.  Eyes: PERRLA, EOMs, conjunctiva no swelling or erythema, normal fundi and vessels.  Sinuses: No Frontal/maxillary tenderness  ENT/Mouth: Ext aud canals clear, normal light reflex with TMs without erythema, bulging. Good dentition. no swelling, or exudate on post pharynx. Tonsils not swollen or erythematous. Hearing normal.  Neck: Supple, thyroid nodules appreciated. No bruits  Respiratory: Respiratory effort normal,  BS equal bilaterally without rales, rhonchi, wheezing or stridor.  Cardio: RRR without murmurs, rubs or gallops. Brisk peripheral pulses without edema.  Chest: symmetric, with normal excursions and percussion.  Breasts: defer Dr. Juliene Pina Abdomen: Soft, , flat, no guarding, rebound, hernias, masses, or organomegaly. .  Lymphatics: Non tender without lymphadenopathy.  Genitourinary: .defer Dr. Mitzi Hansen Musculoskeletal: Full ROM all peripheral extremities,5/5 strength, and normal gait. Skin: Warm, dry without rashes, lesions, ecchymosis. Neuro: Cranial nerves intact, reflexes equal bilaterally. Normal muscle tone, no cerebellar symptoms. Psych: Awake and oriented X 3, normal affect, Insight and Judgment appropriate.   EKG: Sinus bradycardia, no ST changes- pt runs iron man races   Jasmine Combs E  1:04 PM  [facility]

## 2022-12-20 ENCOUNTER — Encounter: Payer: Self-pay | Admitting: Nurse Practitioner

## 2022-12-20 ENCOUNTER — Ambulatory Visit (INDEPENDENT_AMBULATORY_CARE_PROVIDER_SITE_OTHER): Payer: 59 | Admitting: Nurse Practitioner

## 2022-12-20 VITALS — BP 106/72 | HR 47 | Temp 97.7°F | Ht 66.0 in | Wt 155.0 lb

## 2022-12-20 DIAGNOSIS — E559 Vitamin D deficiency, unspecified: Secondary | ICD-10-CM

## 2022-12-20 DIAGNOSIS — I10 Essential (primary) hypertension: Secondary | ICD-10-CM | POA: Diagnosis not present

## 2022-12-20 DIAGNOSIS — Z1329 Encounter for screening for other suspected endocrine disorder: Secondary | ICD-10-CM

## 2022-12-20 DIAGNOSIS — Z131 Encounter for screening for diabetes mellitus: Secondary | ICD-10-CM

## 2022-12-20 DIAGNOSIS — Z1389 Encounter for screening for other disorder: Secondary | ICD-10-CM

## 2022-12-20 DIAGNOSIS — E785 Hyperlipidemia, unspecified: Secondary | ICD-10-CM

## 2022-12-20 DIAGNOSIS — F419 Anxiety disorder, unspecified: Secondary | ICD-10-CM

## 2022-12-20 DIAGNOSIS — Z0001 Encounter for general adult medical examination with abnormal findings: Secondary | ICD-10-CM

## 2022-12-20 DIAGNOSIS — Z0000 Encounter for general adult medical examination without abnormal findings: Secondary | ICD-10-CM | POA: Diagnosis not present

## 2022-12-20 DIAGNOSIS — Z136 Encounter for screening for cardiovascular disorders: Secondary | ICD-10-CM | POA: Diagnosis not present

## 2022-12-20 DIAGNOSIS — E538 Deficiency of other specified B group vitamins: Secondary | ICD-10-CM

## 2022-12-20 DIAGNOSIS — Z79899 Other long term (current) drug therapy: Secondary | ICD-10-CM

## 2022-12-20 NOTE — Patient Instructions (Signed)

## 2022-12-21 LAB — CBC WITH DIFFERENTIAL/PLATELET
Absolute Lymphocytes: 1887 {cells}/uL (ref 850–3900)
Absolute Monocytes: 534 {cells}/uL (ref 200–950)
Basophils Absolute: 27 {cells}/uL (ref 0–200)
Basophils Relative: 0.3 %
Eosinophils Absolute: 53 {cells}/uL (ref 15–500)
Eosinophils Relative: 0.6 %
HCT: 42.5 % (ref 35.0–45.0)
Hemoglobin: 13.6 g/dL (ref 11.7–15.5)
MCH: 30.5 pg (ref 27.0–33.0)
MCHC: 32 g/dL (ref 32.0–36.0)
MCV: 95.3 fL (ref 80.0–100.0)
MPV: 11.1 fL (ref 7.5–12.5)
Monocytes Relative: 6 %
Neutro Abs: 6399 {cells}/uL (ref 1500–7800)
Neutrophils Relative %: 71.9 %
Platelets: 211 10*3/uL (ref 140–400)
RBC: 4.46 10*6/uL (ref 3.80–5.10)
RDW: 12.4 % (ref 11.0–15.0)
Total Lymphocyte: 21.2 %
WBC: 8.9 10*3/uL (ref 3.8–10.8)

## 2022-12-21 LAB — LIPID PANEL
Cholesterol: 230 mg/dL — ABNORMAL HIGH (ref <200)
HDL: 92 mg/dL (ref >=50)
LDL Cholesterol (Calc): 118 mg/dL — ABNORMAL HIGH
Non-HDL Cholesterol (Calc): 138 mg/dL — ABNORMAL HIGH (ref <130)
Total CHOL/HDL Ratio: 2.5 (calc) (ref <5.0)
Triglycerides: 92 mg/dL (ref <150)

## 2022-12-21 LAB — COMPLETE METABOLIC PANEL WITH GFR
AG Ratio: 1.8 (calc) (ref 1.0–2.5)
ALT: 13 U/L (ref 6–29)
AST: 18 U/L (ref 10–30)
Albumin: 4.7 g/dL (ref 3.6–5.1)
Alkaline phosphatase (APISO): 40 U/L (ref 31–125)
BUN/Creatinine Ratio: 12 (calc) (ref 6–22)
BUN: 14 mg/dL (ref 7–25)
CO2: 28 mmol/L (ref 20–32)
Calcium: 10 mg/dL (ref 8.6–10.2)
Chloride: 105 mmol/L (ref 98–110)
Creat: 1.18 mg/dL — ABNORMAL HIGH (ref 0.50–0.99)
Globulin: 2.6 g/dL (ref 1.9–3.7)
Glucose, Bld: 83 mg/dL (ref 65–99)
Potassium: 4.8 mmol/L (ref 3.5–5.3)
Sodium: 141 mmol/L (ref 135–146)
Total Bilirubin: 0.8 mg/dL (ref 0.2–1.2)
Total Protein: 7.3 g/dL (ref 6.1–8.1)
eGFR: 59 mL/min/{1.73_m2} — ABNORMAL LOW (ref >=60)

## 2022-12-21 LAB — VITAMIN D 25 HYDROXY (VIT D DEFICIENCY, FRACTURES): Vit D, 25-Hydroxy: 21 ng/mL — ABNORMAL LOW (ref 30–100)

## 2022-12-21 LAB — URINALYSIS, ROUTINE W REFLEX MICROSCOPIC
Bilirubin Urine: NEGATIVE
Glucose, UA: NEGATIVE
Hgb urine dipstick: NEGATIVE
Ketones, ur: NEGATIVE
Leukocytes,Ua: NEGATIVE
Nitrite: NEGATIVE
Protein, ur: NEGATIVE
Specific Gravity, Urine: 1.014 (ref 1.001–1.035)
pH: 6.5 (ref 5.0–8.0)

## 2022-12-21 LAB — MICROALBUMIN / CREATININE URINE RATIO
Creatinine, Urine: 73 mg/dL (ref 20–275)
Microalb Creat Ratio: 3 mg/g{creat} (ref <30)
Microalb, Ur: 0.2 mg/dL

## 2022-12-21 LAB — VITAMIN B12: Vitamin B-12: 464 pg/mL (ref 200–1100)

## 2022-12-21 LAB — MAGNESIUM: Magnesium: 2.3 mg/dL (ref 1.5–2.5)

## 2022-12-21 LAB — TSH: TSH: 1.69 m[IU]/L

## 2022-12-21 LAB — HEMOGLOBIN A1C W/OUT EAG: Hgb A1c MFr Bld: 5.1 %{Hb} (ref <5.7)

## 2023-01-12 ENCOUNTER — Ambulatory Visit
Admission: RE | Admit: 2023-01-12 | Discharge: 2023-01-12 | Disposition: A | Discharge status: Home / Self Care | Payer: No Typology Code available for payment source | Source: Ambulatory Visit | Attending: Nurse Practitioner | Admitting: Nurse Practitioner

## 2023-01-12 DIAGNOSIS — E785 Hyperlipidemia, unspecified: Secondary | ICD-10-CM

## 2023-08-10 IMAGING — MG MM DIGITAL SCREENING BILAT W/ TOMO AND CAD
8 series · 9 of 24 positions shown · non-contrast
Comparison: Previous exam(s).

CLINICAL DATA: Screening.

EXAM:
DIGITAL SCREENING BILATERAL MAMMOGRAM WITH TOMOSYNTHESIS AND CAD
TECHNIQUE: Bilateral screening digital craniocaudal and mediolateral oblique
mammograms were obtained. Bilateral screening digital breast
tomosynthesis was performed. The images were evaluated with
computer-aided detection.

[L CC synth-2D]
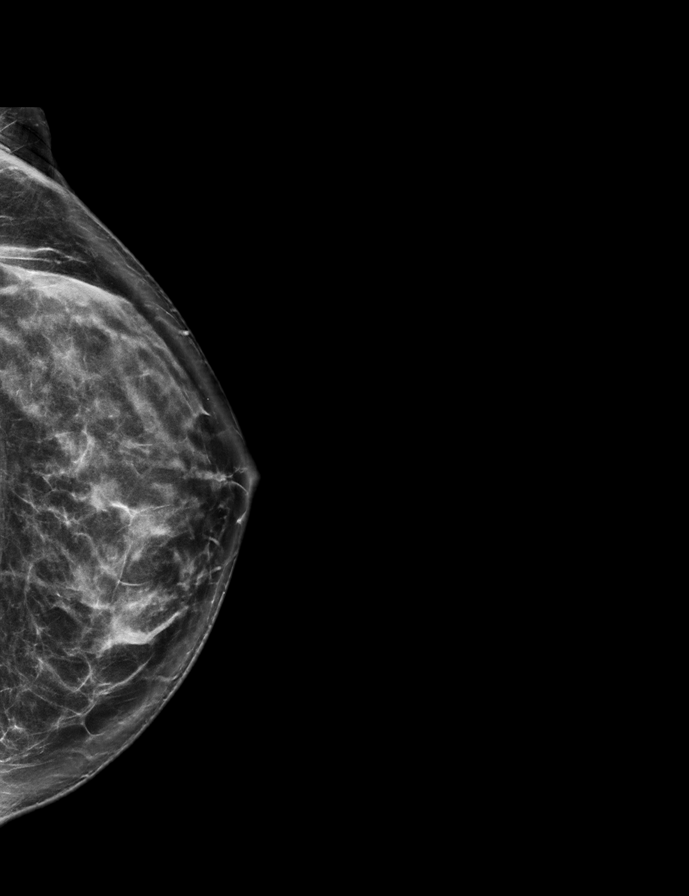

[R CC synth-2D]
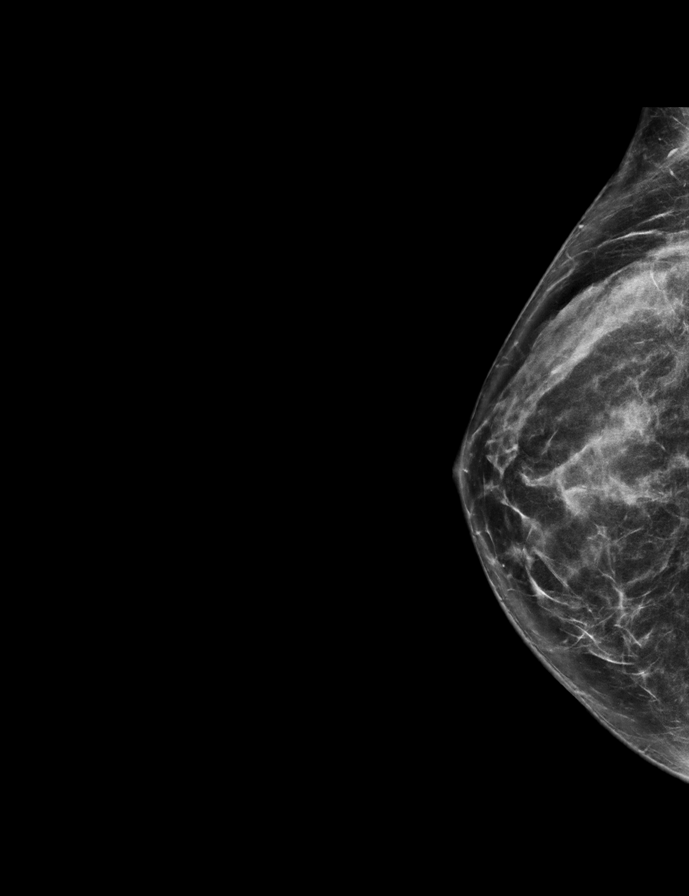

[L MLO synth-2D]
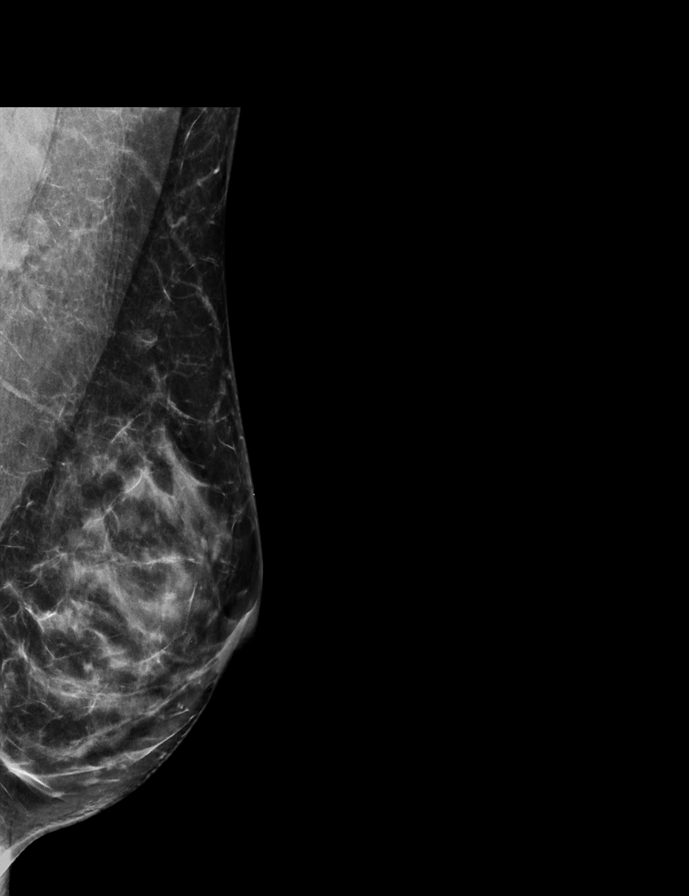

[R MLO synth-2D]
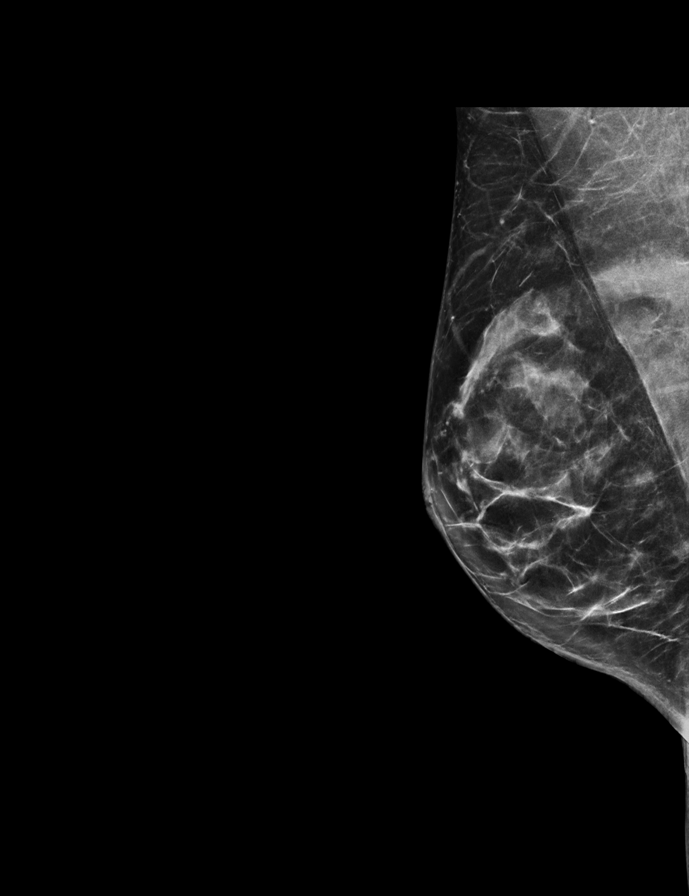

[L MLO tomo · 2 of 76 frames shown]
[frame 25/76]
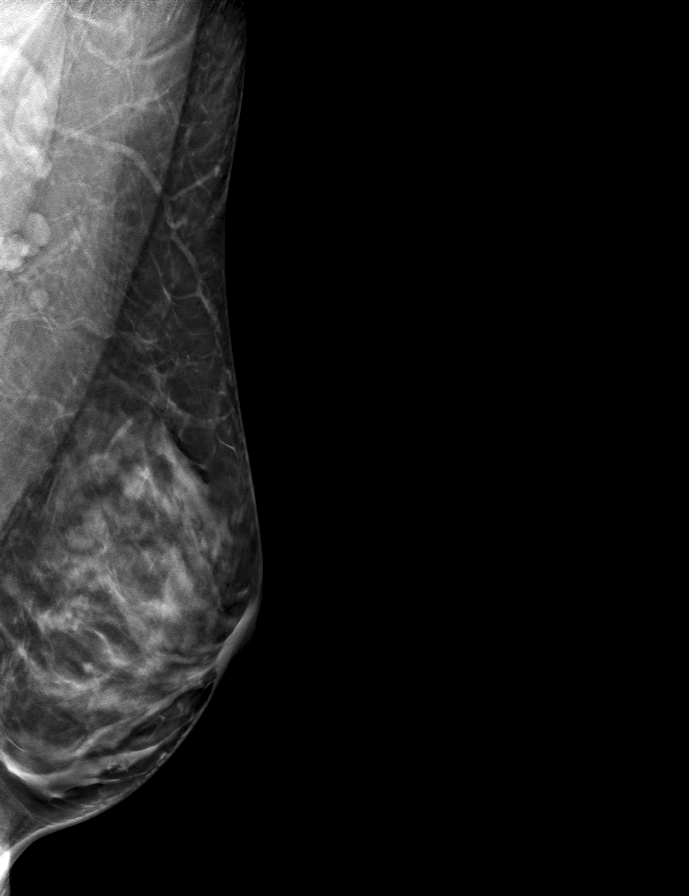
[frame 39/76]
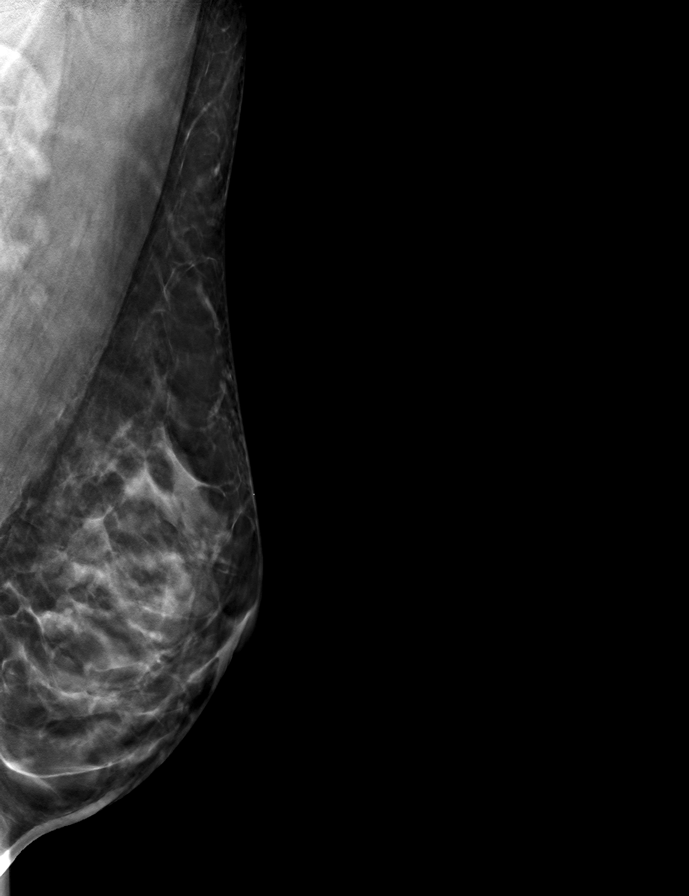

[R MLO tomo · tomo slice 35/70.0]
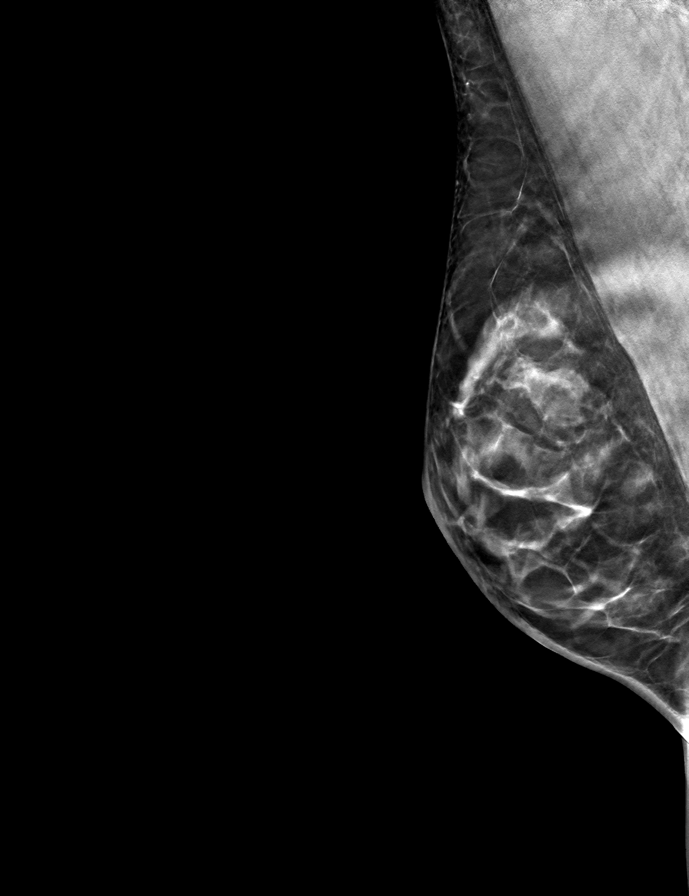

[R CC tomo · tomo slice 35/69.0]
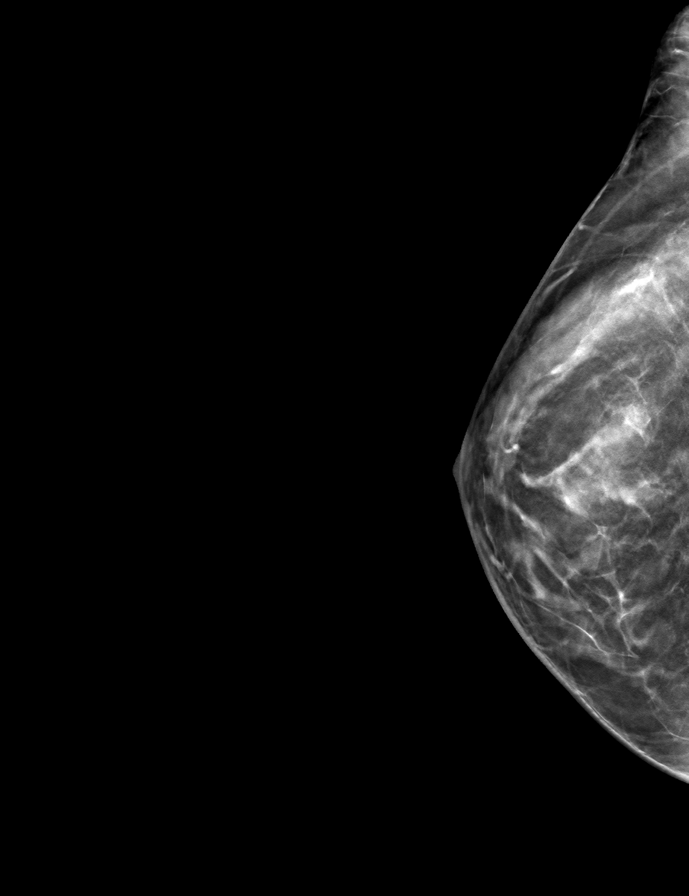

[L CC tomo · tomo slice 38/75.0]
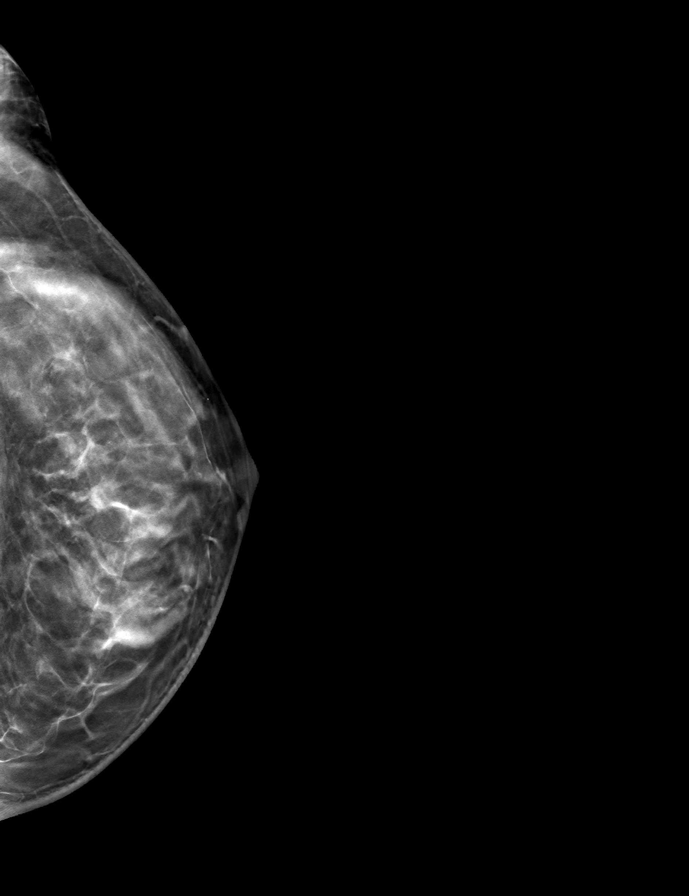

[9 of 24 positions shown; findings below may reference images not displayed]

ACR Breast Density Category c: The breast tissue is heterogeneously
dense, which may obscure small masses.
FINDINGS: There are no findings suspicious for malignancy.
IMPRESSION: No mammographic evidence of malignancy. A result letter of this
screening mammogram will be mailed directly to the patient.

RECOMMENDATION:
Screening mammogram in one year. (Code:Q3-W-BC3)

BI-RADS CATEGORY  1: Negative.

## 2023-10-31 ENCOUNTER — Encounter: Payer: 59 | Admitting: Nurse Practitioner

## 2023-11-12 ENCOUNTER — Encounter: Payer: 59 | Admitting: Nurse Practitioner

## 2023-12-20 ENCOUNTER — Encounter: Payer: 59 | Admitting: Nurse Practitioner
